# Patient Record
Sex: Female | Born: 1945 | Race: White | Hispanic: No | Marital: Married | Smoking: Former smoker
Health system: Southern US, Community
[De-identification: ages and names within clinical notes are randomized; demographics above are authoritative.]

## PROBLEM LIST (undated history)

## (undated) DIAGNOSIS — T7840XA Allergy, unspecified, initial encounter: Secondary | ICD-10-CM

## (undated) DIAGNOSIS — I10 Essential (primary) hypertension: Secondary | ICD-10-CM

## (undated) DIAGNOSIS — E079 Disorder of thyroid, unspecified: Secondary | ICD-10-CM

## (undated) DIAGNOSIS — F419 Anxiety disorder, unspecified: Secondary | ICD-10-CM

## (undated) DIAGNOSIS — N289 Disorder of kidney and ureter, unspecified: Secondary | ICD-10-CM

## (undated) HISTORY — DX: Allergy, unspecified, initial encounter: T78.40XA

## (undated) HISTORY — DX: Essential (primary) hypertension: I10

## (undated) HISTORY — PX: CATARACT EXTRACTION: SUR2

## (undated) HISTORY — PX: LASIK: SHX215

## (undated) HISTORY — PX: ABDOMINAL HYSTERECTOMY: SHX81

## (undated) HISTORY — DX: Anxiety disorder, unspecified: F41.9

## (undated) HISTORY — DX: Disorder of thyroid, unspecified: E07.9

---

## 2000-08-13 ENCOUNTER — Ambulatory Visit (HOSPITAL_COMMUNITY): Admission: RE | Admit: 2000-08-13 | Discharge: 2000-08-13 | Payer: Self-pay | Admitting: Gastroenterology

## 2000-08-13 ENCOUNTER — Encounter (INDEPENDENT_AMBULATORY_CARE_PROVIDER_SITE_OTHER): Payer: Self-pay | Admitting: Specialist

## 2000-11-14 ENCOUNTER — Emergency Department (HOSPITAL_COMMUNITY): Admission: EM | Admit: 2000-11-14 | Discharge: 2000-11-14 | Payer: Self-pay | Admitting: Emergency Medicine

## 2001-01-25 ENCOUNTER — Encounter: Payer: Self-pay | Admitting: Obstetrics and Gynecology

## 2001-01-25 ENCOUNTER — Encounter: Admission: RE | Admit: 2001-01-25 | Discharge: 2001-01-25 | Payer: Self-pay | Admitting: Obstetrics and Gynecology

## 2002-03-09 ENCOUNTER — Encounter: Payer: Self-pay | Admitting: Obstetrics and Gynecology

## 2002-03-09 ENCOUNTER — Encounter: Admission: RE | Admit: 2002-03-09 | Discharge: 2002-03-09 | Payer: Self-pay | Admitting: Obstetrics and Gynecology

## 2002-03-16 ENCOUNTER — Encounter: Payer: Self-pay | Admitting: Obstetrics and Gynecology

## 2002-03-16 ENCOUNTER — Encounter: Admission: RE | Admit: 2002-03-16 | Discharge: 2002-03-16 | Payer: Self-pay | Admitting: Obstetrics and Gynecology

## 2003-04-24 ENCOUNTER — Encounter: Payer: Self-pay | Admitting: Obstetrics and Gynecology

## 2003-04-24 ENCOUNTER — Encounter: Admission: RE | Admit: 2003-04-24 | Discharge: 2003-04-24 | Payer: Self-pay | Admitting: Obstetrics and Gynecology

## 2004-07-12 ENCOUNTER — Encounter: Admission: RE | Admit: 2004-07-12 | Discharge: 2004-07-12 | Payer: Self-pay | Admitting: Obstetrics and Gynecology

## 2004-08-29 ENCOUNTER — Encounter: Admission: RE | Admit: 2004-08-29 | Discharge: 2004-08-29 | Payer: Self-pay | Admitting: Endocrinology

## 2005-09-30 ENCOUNTER — Encounter: Admission: RE | Admit: 2005-09-30 | Discharge: 2005-09-30 | Payer: Self-pay | Admitting: Obstetrics and Gynecology

## 2006-11-04 ENCOUNTER — Encounter: Admission: RE | Admit: 2006-11-04 | Discharge: 2006-11-04 | Payer: Self-pay | Admitting: Endocrinology

## 2008-04-13 ENCOUNTER — Encounter: Admission: RE | Admit: 2008-04-13 | Discharge: 2008-04-13 | Payer: Self-pay | Admitting: Internal Medicine

## 2010-02-04 ENCOUNTER — Encounter: Admission: RE | Admit: 2010-02-04 | Discharge: 2010-02-04 | Payer: Self-pay | Admitting: Internal Medicine

## 2010-02-08 ENCOUNTER — Encounter: Admission: RE | Admit: 2010-02-08 | Discharge: 2010-02-08 | Payer: Self-pay | Admitting: Internal Medicine

## 2011-01-09 ENCOUNTER — Other Ambulatory Visit: Payer: Self-pay | Admitting: Internal Medicine

## 2011-01-09 DIAGNOSIS — Z1231 Encounter for screening mammogram for malignant neoplasm of breast: Secondary | ICD-10-CM

## 2011-03-13 ENCOUNTER — Ambulatory Visit
Admission: RE | Admit: 2011-03-13 | Discharge: 2011-03-13 | Disposition: A | Discharge status: Home / Self Care | Payer: 59 | Source: Ambulatory Visit | Attending: Internal Medicine | Admitting: Internal Medicine

## 2011-03-13 DIAGNOSIS — Z1231 Encounter for screening mammogram for malignant neoplasm of breast: Secondary | ICD-10-CM

## 2011-04-18 NOTE — Procedures (Signed)
[facility]  Patient:    Shannon Blevins, Shannon Blevins                      MRN: [id number] Proc. Date: 08/13/00 Adm. Date:  [id number] Disc. Date: [id number] Attending:  Rich Brave CC:         Reather Littler, M.D.   Procedure Report  PROCEDURE:  Colonoscopy.  INDICATIONS FOR PROCEDURE:  This is a 65 year old female with Hemoccult positive stool on 3/3 Hemoccults submitted to her primary physicians office. Her upper endoscopy was negative.  FINDINGS:  Normal exam to the terminal ileum.  DESCRIPTION OF PROCEDURE:  The nature, purpose and risk of the procedure had been discussed with the patient who provided written consent. This procedure was performed immediately following her upper endoscopy and total sedation for the 2 procedures was fentanyl 100 mcg and Versed 10 mg IV without arrhythmias or desaturations.  The Olympus pediatric video colonoscope was used initially for this procedure but it looped and locked too much in the rectosigmoid region so it was rapidly removed and I substituted the Olympus adult video colonoscope which was able to go through that area without excessive looping, and with overriding of loops, we advanced the colon quite readily. We turned the patient into the supine position to get around the hepatic flexure, reached the cecum and then entered the terminal ileum for a moderate distance. It had a normal mucosal appearance. Pullback was therefore performed.  This was a normal colonoscopy. No polyps, cancer, colitis, vascular malformations or diverticular disease were observed. Retroflexion in the rectum was normal. The quality of the prep was very good and it is felt that all areas were adequately seen.  No biopsies were obtained. The patient tolerated the procedure well and there were no apparent complications.  IMPRESSION:  Normal colonoscopy. No source of heme positive stool identified.  PLAN:  Home Hemoccults x 2. It is  presumed that the patients heme positivity probably resulted from transient gastric irritation related to her use of BCs on an occasional basis, although there was no evidence of aspirin related gastropathy on todays EGD at the present time.  If further Hemoccult testing is negative, since the patient is asymptomatic, I feel we could sit tight, without further diagnostic evaluation. DD:  08/13/00 TD:  08/15/00 Job: [id number]

## 2011-04-18 NOTE — Procedures (Signed)
[facility]  Patient:    Shannon Blevins, Shannon Blevins                      MRN: [id number] Proc. Date: 08/13/00 Adm. Date:  [id number] Disc. Date: [id number] Attending:  Rich Brave CC:         Reather Littler, M.D.   Procedure Report  PROCEDURE:  Upper endoscopy.  INDICATIONS FOR PROCEDURE:  A 65 year old with 3/3 Hemoccult positive stools on Hemoccults submitted to her primary physician. She was heme negative when checked by me at the office.  FINDINGS:  Normal exam.  DESCRIPTION OF PROCEDURE:  The nature, purpose and risk of the procedure had been discussed with the patient who provided written consent. Sedation was fentanyl 100 mcg and Versed 10 mg IV for this procedure and the colonoscopy which followed it. There was no problem with arrhythmias or desaturations.  The Olympus video endoscope was passed under direct vision. The vocal cords were not well seen but no obvious laryngeal abnormalities were noted. The esophagus was readily entered and was normal, without evidence of reflux esophagitis, gastroesophageal reflux, Barretts esophagus, varices, infection, neoplasia, ring, stricture or hiatal hernia.  The stomach was entered. It contained no significant residual and had normal mucosa without evidence of gastritis, erosions, ulcers, polyps or masses including a retroflexed view of the proximal stomach.  The pylorus, duodenal bulb and second duodenum looked normal.  The scope was then removed from the patient who tolerated the procedure well and without apparent complications.  IMPRESSION:  Normal upper endoscopy. No source of heme positive stool identified.  PLAN:  Proceed to colonoscopic evaluation. DD:  08/13/00 TD:  08/15/00 Job: [id number]

## 2012-10-04 ENCOUNTER — Other Ambulatory Visit: Payer: Self-pay | Admitting: Internal Medicine

## 2012-10-04 DIAGNOSIS — R109 Unspecified abdominal pain: Secondary | ICD-10-CM

## 2012-10-06 ENCOUNTER — Other Ambulatory Visit: Payer: Self-pay | Admitting: Internal Medicine

## 2012-10-06 DIAGNOSIS — Z1231 Encounter for screening mammogram for malignant neoplasm of breast: Secondary | ICD-10-CM

## 2012-10-07 ENCOUNTER — Other Ambulatory Visit: Payer: 59

## 2012-10-18 ENCOUNTER — Ambulatory Visit
Admission: RE | Admit: 2012-10-18 | Discharge: 2012-10-18 | Disposition: A | Discharge status: Home / Self Care | Payer: Medicare Other | Source: Ambulatory Visit | Attending: Internal Medicine | Admitting: Internal Medicine

## 2012-10-18 DIAGNOSIS — R109 Unspecified abdominal pain: Secondary | ICD-10-CM

## 2012-11-10 ENCOUNTER — Ambulatory Visit
Admission: RE | Admit: 2012-11-10 | Discharge: 2012-11-10 | Disposition: A | Discharge status: Home / Self Care | Payer: Medicare Other | Source: Ambulatory Visit | Attending: Internal Medicine | Admitting: Internal Medicine

## 2012-11-10 DIAGNOSIS — Z1231 Encounter for screening mammogram for malignant neoplasm of breast: Secondary | ICD-10-CM

## 2013-05-07 ENCOUNTER — Ambulatory Visit (INDEPENDENT_AMBULATORY_CARE_PROVIDER_SITE_OTHER): Payer: Medicare Other | Admitting: Family Medicine

## 2013-05-07 VITALS — BP 108/66 | HR 68 | Temp 98.0°F | Resp 16 | Ht 62.5 in | Wt 147.6 lb

## 2013-05-07 DIAGNOSIS — T1592XA Foreign body on external eye, part unspecified, left eye, initial encounter: Secondary | ICD-10-CM

## 2013-05-07 DIAGNOSIS — T1590XA Foreign body on external eye, part unspecified, unspecified eye, initial encounter: Secondary | ICD-10-CM

## 2013-05-07 DIAGNOSIS — H5789 Other specified disorders of eye and adnexa: Secondary | ICD-10-CM

## 2013-05-07 DIAGNOSIS — H579 Unspecified disorder of eye and adnexa: Secondary | ICD-10-CM

## 2013-05-07 NOTE — Progress Notes (Signed)
[facility]:  Office Visit  Chief Complaint:  Chief Complaint  Patient presents with  . Eye Pain    left eye feels like somethings in it since this morning    HPI: Shannon Blevins is a 67 y.o. female who complains of  Left eye irritation and was pulling weeds.  She has had cataract surgery. Denies glaucoma Denies vision changes. Denies light sensitivity, eye swelling. Feels like there is something in it. Does not feel itchy.  No sore throat, ear pain, fevers, chills or URI sxs She has had new eyeliner  Past Medical History  Diagnosis Date  . Allergy   . Anxiety   . Thyroid disease   . Hypertension    Past Surgical History  Procedure Laterality Date  . Abdominal hysterectomy    . Cataract extraction    . Lasik     History   Social History  . Marital Status: Married    Spouse Name: N/A    Number of Children: N/A  . Years of Education: N/A   Social History Main Topics  . Smoking status: Former Games developer  . Smokeless tobacco: None  . Alcohol Use: None  . Drug Use: None  . Sexually Active: None   Other Topics Concern  . None   Social History Narrative  . None   Family History  Problem Relation Age of Onset  . Cancer Mother   . Hypertension Mother   . Heart disease Mother   . Alcohol abuse Father   . Diabetes Paternal Grandmother   . Cancer Paternal Grandmother    Allergies  Allergen Reactions  . Sulfa Antibiotics    Prior to Admission medications   Medication Sig Start Date End Date Taking? Authorizing Provider  atenolol (TENORMIN) 50 MG tablet Take 50 mg by mouth daily.   Yes Historical Provider, MD  calcium-vitamin D (OSCAL WITH D) 250-125 MG-UNIT per tablet Take 1 tablet by mouth daily.   Yes Historical Provider, MD  cetirizine (ZYRTEC) 10 MG tablet Take 10 mg by mouth daily.   Yes Historical Provider, MD  diltiazem (DILACOR XR) 240 MG 24 hr capsule Take 240 mg by mouth daily.   Yes Historical Provider, MD  docusate sodium (COLACE)  100 MG capsule Take 100 mg by mouth 2 (two) times daily.   Yes Historical Provider, MD  estradiol (ESTRACE) 2 MG tablet Take 2 mg by mouth daily.   Yes Historical Provider, MD  ferrous fumarate (HEMOCYTE - 106 MG FE) 325 (106 FE) MG TABS Take 1 tablet by mouth.   Yes Historical Provider, MD  FLUoxetine (PROZAC) 40 MG capsule Take 40 mg by mouth daily.   Yes Historical Provider, MD  hydrochlorothiazide (HYDRODIURIL) 12.5 MG tablet Take 12.5 mg by mouth daily.   Yes Historical Provider, MD  levothyroxine (SYNTHROID, LEVOTHROID) 137 MCG tablet Take 137 mcg by mouth daily before breakfast.   Yes Historical Provider, MD  Multiple Vitamin (MULTIVITAMIN) tablet Take 1 tablet by mouth daily.   Yes Historical Provider, MD  butalbital-acetaminophen-caffeine (FIORICET WITH CODEINE) 50-325-40-30 MG per capsule Take 1 capsule by mouth every 4 (four) hours as needed for headache.    Historical Provider, MD     ROS: The patient denies fevers, chills, night sweats, unintentional weight loss, chest pain, palpitations, wheezing, dyspnea on exertion, nausea, vomiting, abdominal pain, dysuria, hematuria, melena, numbness, weakness, or tingling.   All other systems have been reviewed and were otherwise negative with the exception of those mentioned in  the HPI and as above.    PHYSICAL EXAM: Filed Vitals:   05/07/13 1316  BP: 108/66  Pulse: 68  Temp: 98 F (36.7 C)  Resp: 16   Filed Vitals:   05/07/13 1316  Height: 5' 2.5" (1.588 m)  Weight: 147 lb 9.6 oz (66.951 kg)   Body mass index is 26.55 kg/(m^2).  General: Alert, no acute distress HEENT:  Normocephalic, atraumatic, oropharynx patent. EOMI, PERRLA, fundoscopic exam grossly normal Cardiovascular:  Regular rate and rhythm, no rubs murmurs or gallops.  No Carotid bruits, radial pulse intact. No pedal edema.  Respiratory: Clear to auscultation bilaterally.  No wheezes, rales, or rhonchi.  No cyanosis, no use of accessory musculature GI: No  organomegaly, abdomen is soft and non-tender, positive bowel sounds.  No masses. Skin: No rashes. Neurologic: Facial musculature symmetric. Psychiatric: Patient is appropriate throughout our interaction. Lymphatic: No cervical lymphadenopathy Musculoskeletal: Gait intact.   LABS: No results found for this or any previous visit.   EKG/XRAY:   Primary read interpreted by Dr. Conley Rolls at [facility].   ASSESSMENT/PLAN: Encounter Diagnoses  Name Primary?  . Irritation of left eye Yes  . Foreign body in eye, left, initial encounter    Piece of Eye liner pigment  Found in upper left corner in eye No corneal abrasion on fluoroscein exam Flushed out eye with sterile saline, eye irritation resolved after flush F/u prn     Barbi Kumagai PHUONG, DO 05/07/2013 2:17 PM

## 2013-11-07 ENCOUNTER — Other Ambulatory Visit: Payer: Self-pay

## 2013-11-07 DIAGNOSIS — Z1231 Encounter for screening mammogram for malignant neoplasm of breast: Secondary | ICD-10-CM

## 2013-12-15 ENCOUNTER — Ambulatory Visit: Payer: Medicare Other

## 2014-01-26 ENCOUNTER — Ambulatory Visit: Payer: Medicare Other

## 2014-03-13 ENCOUNTER — Ambulatory Visit
Admission: RE | Admit: 2014-03-13 | Discharge: 2014-03-13 | Disposition: A | Discharge status: Home / Self Care | Payer: Medicare HMO | Source: Ambulatory Visit

## 2014-03-13 DIAGNOSIS — Z1231 Encounter for screening mammogram for malignant neoplasm of breast: Secondary | ICD-10-CM

## 2014-03-14 ENCOUNTER — Other Ambulatory Visit: Payer: Self-pay | Admitting: Internal Medicine

## 2014-03-14 DIAGNOSIS — R928 Other abnormal and inconclusive findings on diagnostic imaging of breast: Secondary | ICD-10-CM

## 2014-03-24 ENCOUNTER — Encounter (INDEPENDENT_AMBULATORY_CARE_PROVIDER_SITE_OTHER): Payer: Self-pay

## 2014-03-24 ENCOUNTER — Ambulatory Visit
Admission: RE | Admit: 2014-03-24 | Discharge: 2014-03-24 | Disposition: A | Discharge status: Home / Self Care | Payer: Medicare HMO | Source: Ambulatory Visit | Attending: Internal Medicine | Admitting: Internal Medicine

## 2014-03-24 DIAGNOSIS — R928 Other abnormal and inconclusive findings on diagnostic imaging of breast: Secondary | ICD-10-CM

## 2017-02-23 ENCOUNTER — Other Ambulatory Visit: Payer: Self-pay | Admitting: Internal Medicine

## 2017-02-23 DIAGNOSIS — Z1231 Encounter for screening mammogram for malignant neoplasm of breast: Secondary | ICD-10-CM

## 2017-03-19 ENCOUNTER — Ambulatory Visit
Admission: RE | Admit: 2017-03-19 | Discharge: 2017-03-19 | Disposition: A | Discharge status: Home / Self Care | Payer: Medicare HMO | Source: Ambulatory Visit | Attending: Internal Medicine | Admitting: Internal Medicine

## 2017-03-19 DIAGNOSIS — Z1231 Encounter for screening mammogram for malignant neoplasm of breast: Secondary | ICD-10-CM

## 2018-05-04 ENCOUNTER — Other Ambulatory Visit: Payer: Self-pay | Admitting: Internal Medicine

## 2018-05-04 DIAGNOSIS — Z1231 Encounter for screening mammogram for malignant neoplasm of breast: Secondary | ICD-10-CM

## 2018-05-27 ENCOUNTER — Ambulatory Visit
Admission: RE | Admit: 2018-05-27 | Discharge: 2018-05-27 | Disposition: A | Discharge status: Home / Self Care | Payer: Medicare HMO | Source: Ambulatory Visit | Attending: Internal Medicine | Admitting: Internal Medicine

## 2018-05-27 DIAGNOSIS — Z1231 Encounter for screening mammogram for malignant neoplasm of breast: Secondary | ICD-10-CM

## 2018-05-28 ENCOUNTER — Other Ambulatory Visit: Payer: Self-pay | Admitting: Internal Medicine

## 2018-05-28 DIAGNOSIS — R928 Other abnormal and inconclusive findings on diagnostic imaging of breast: Secondary | ICD-10-CM

## 2018-06-02 ENCOUNTER — Other Ambulatory Visit: Payer: Medicare HMO

## 2018-07-30 ENCOUNTER — Ambulatory Visit
Admission: RE | Admit: 2018-07-30 | Discharge: 2018-07-30 | Disposition: A | Discharge status: Home / Self Care | Payer: Medicare HMO | Source: Ambulatory Visit | Attending: Internal Medicine | Admitting: Internal Medicine

## 2018-07-30 DIAGNOSIS — R928 Other abnormal and inconclusive findings on diagnostic imaging of breast: Secondary | ICD-10-CM

## 2019-05-15 IMAGING — MG DIGITAL DIAGNOSTIC UNILATERAL RIGHT MAMMOGRAM WITH TOMO AND CAD
6 series · 6 of 18 positions shown · non-contrast
Comparison: May 27, 2018 and multiple earlier priors

CLINICAL DATA: 72-year-old patient recalled from recent 2D
screening mammogram for evaluation of possible distortion identified
only on the CC view of the right breast.

EXAM:
DIGITAL DIAGNOSTIC RIGHT MAMMOGRAM WITH CAD AND TOMO
ULTRASOUND RIGHT BREAST

[R CC synth-2D (1 of 2)]
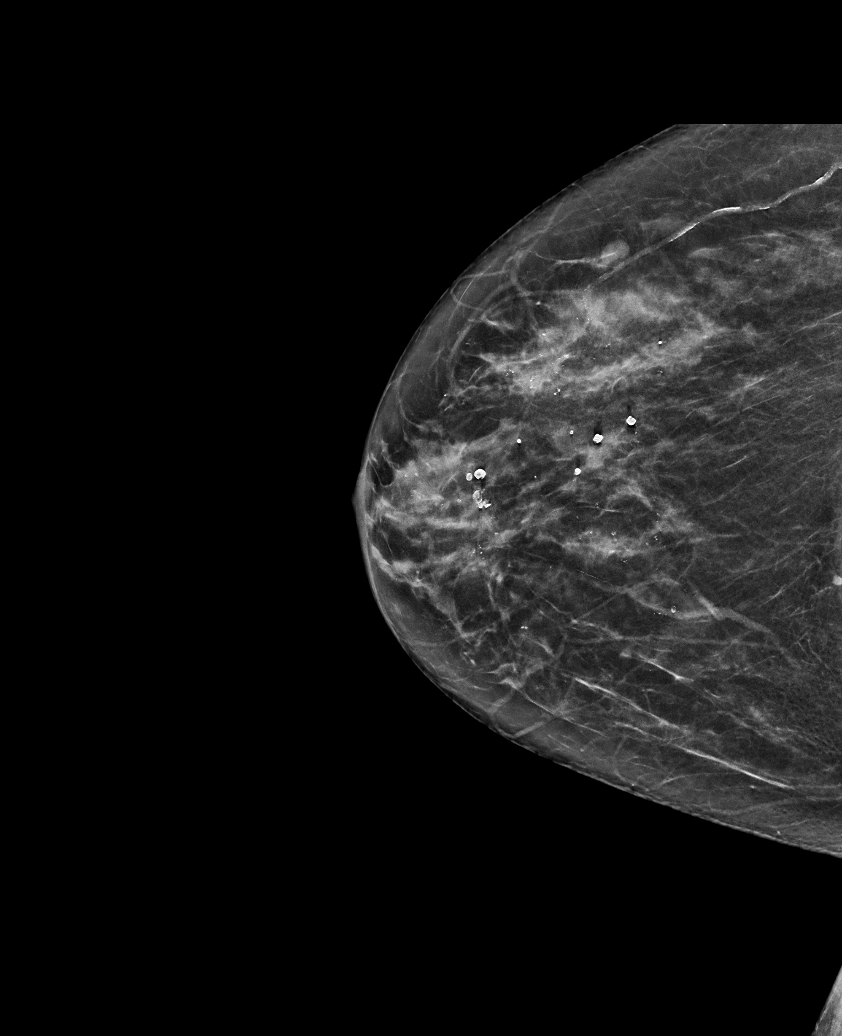

[R MLO synth-2D]
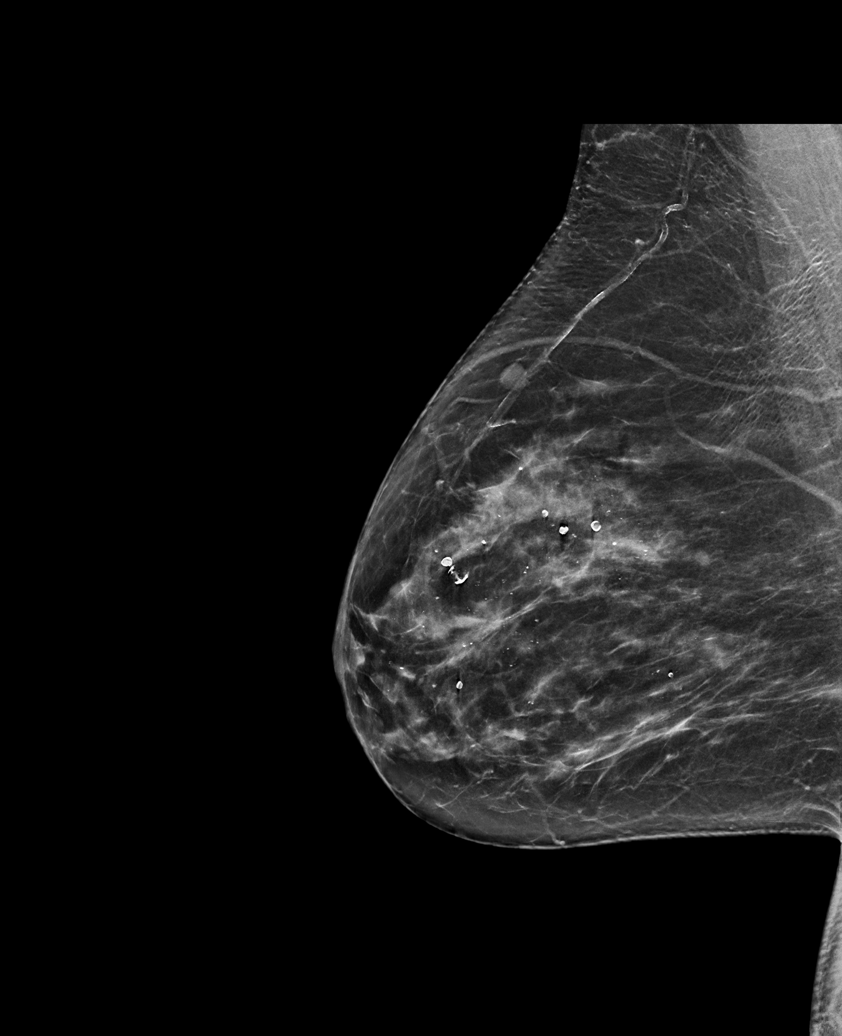

[R CC synth-2D (2 of 2)]
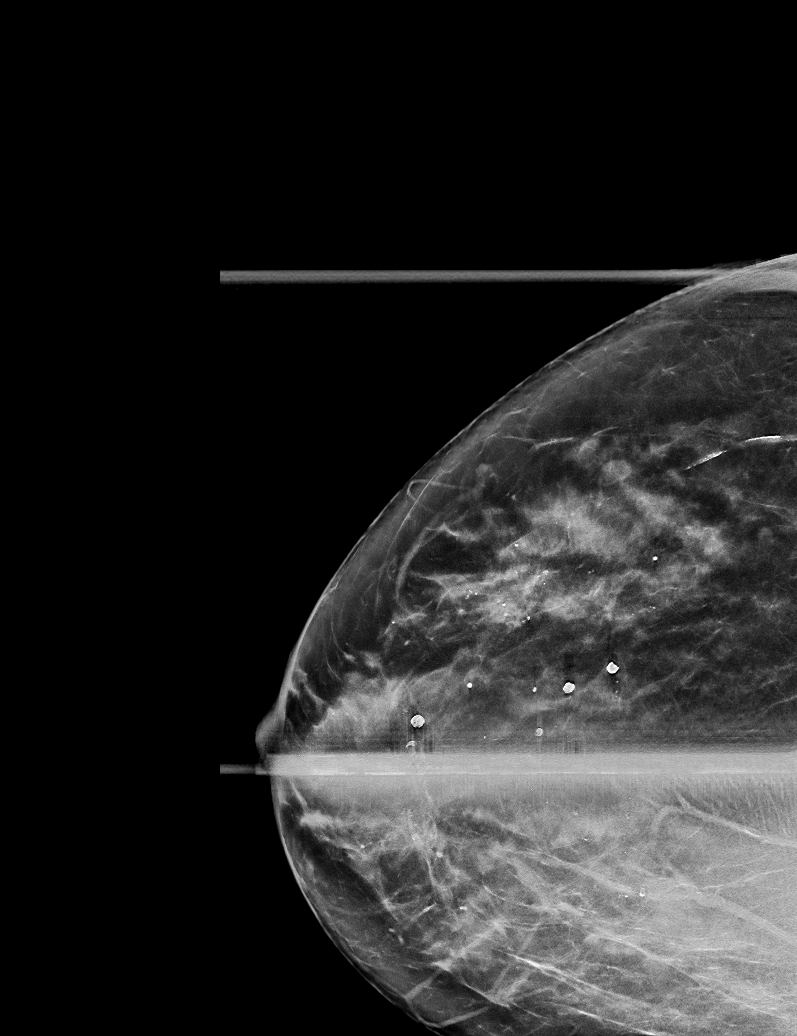

[R CC tomo (1 of 2) · tomo slice 29/58.0]
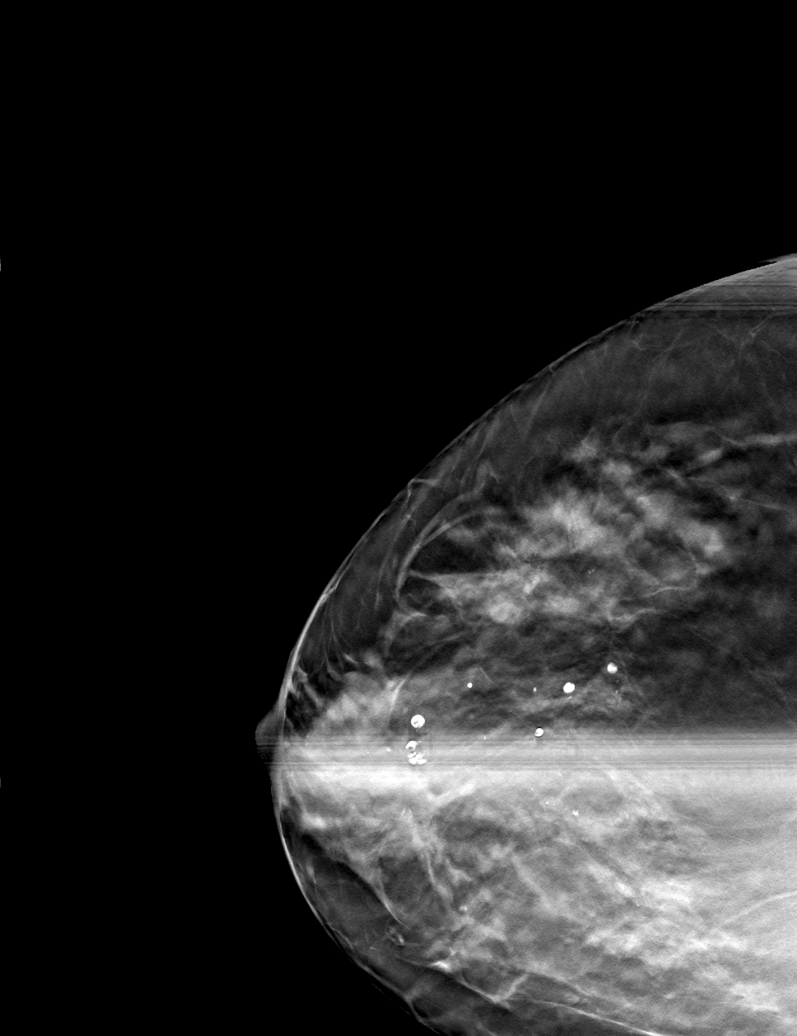

[R CC tomo (2 of 2) · tomo slice 31/61.0]
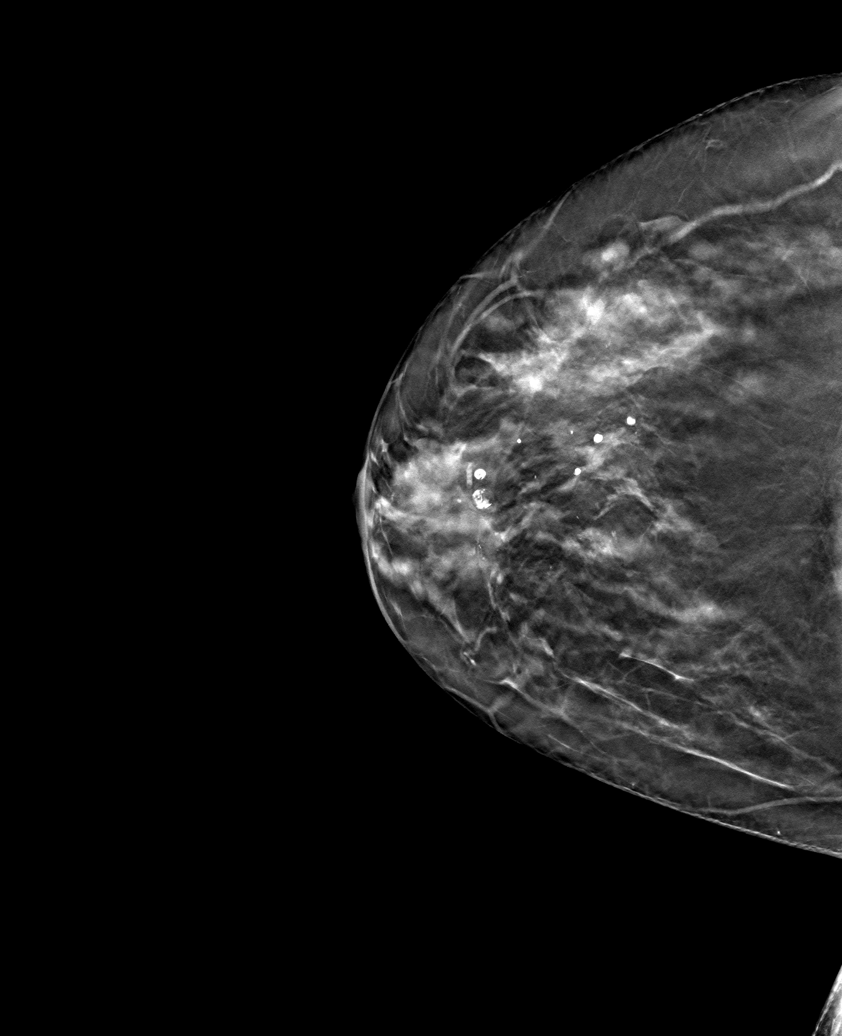

[R MLO tomo · tomo slice 36/71.0]
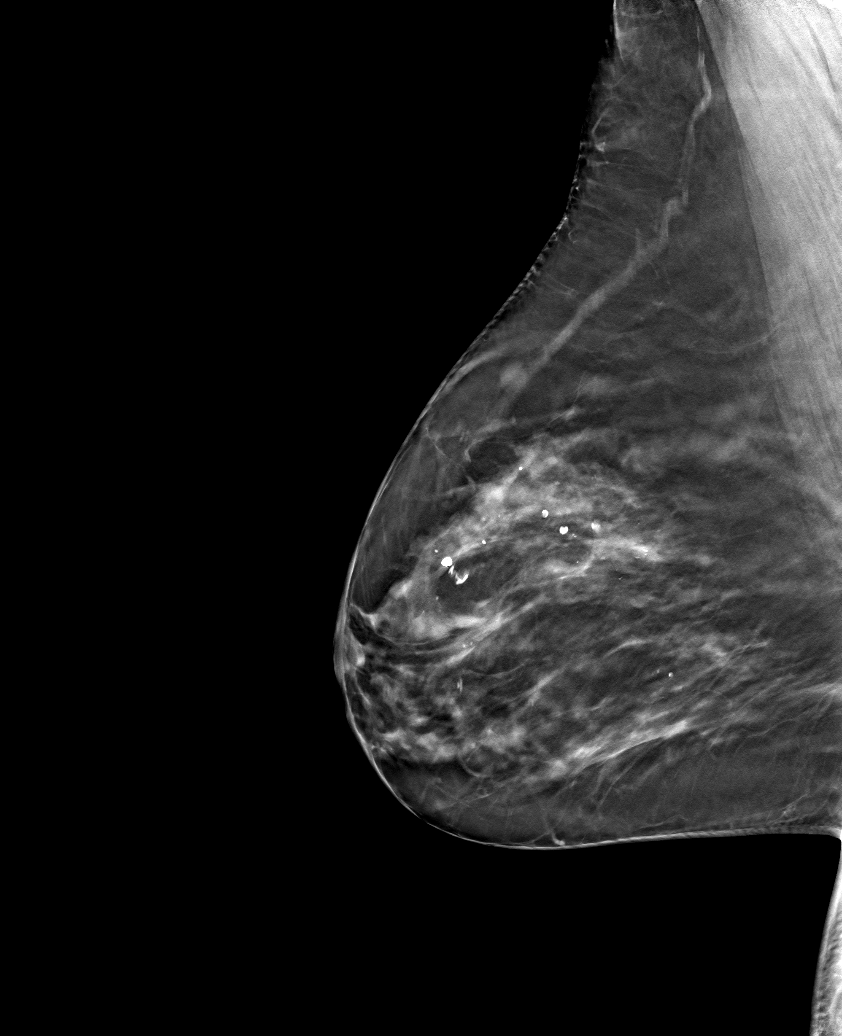

[6 of 18 positions shown; findings below may reference images not displayed]

ACR Breast Density Category c: The breast tissue is heterogeneously
dense, which may obscure small masses.
FINDINGS: Whole breast images in the CC and MLO projection with tomography are
performed. There is an island of dense glandular tissue in the outer
right breast that appears similar to prior mammograms. In the past,
there have been known cysts in this tissue that fluctuate in size
over time. No architectural distortion is identified in this region
on today's mammogram images.

Mammographic images were processed with CAD.

On physical exam, the outer right breast is soft to palpation. No
thickening or mass is felt.

Targeted ultrasound is performed, showing an island of dense
glandular tissue containing a few subcentimeter cysts in the [DATE]
position 5 cm from the nipple, corresponding to the island of dense
tissue on the mammogram. No architectural distortion, abnormal
shadowing, or suspicious mass is seen.
IMPRESSION: No suspicious findings. Dense fibroglandular tissue in the outer
right breast containing a few small cysts. Negative for
architectural distortion.

RECOMMENDATION:
Screening mammogram in one year.(Code:30-W-2MW)

I have discussed the findings and recommendations with the patient.
Results were also provided in writing at the conclusion of the
visit. If applicable, a reminder letter will be sent to the patient
regarding the next appointment.

BI-RADS CATEGORY  2: Benign.

## 2020-04-02 ENCOUNTER — Other Ambulatory Visit: Payer: Self-pay | Admitting: Internal Medicine

## 2020-04-02 ENCOUNTER — Other Ambulatory Visit: Payer: Self-pay

## 2020-04-02 DIAGNOSIS — Z1231 Encounter for screening mammogram for malignant neoplasm of breast: Secondary | ICD-10-CM

## 2020-04-02 DIAGNOSIS — E2839 Other primary ovarian failure: Secondary | ICD-10-CM

## 2020-06-12 ENCOUNTER — Ambulatory Visit: Payer: Medicare HMO

## 2020-06-12 ENCOUNTER — Other Ambulatory Visit: Payer: Medicare HMO

## 2020-08-07 ENCOUNTER — Ambulatory Visit: Payer: Medicare HMO | Admitting: Urology

## 2020-08-27 ENCOUNTER — Other Ambulatory Visit: Payer: Self-pay

## 2020-08-27 ENCOUNTER — Encounter: Payer: Self-pay | Admitting: Urology

## 2020-08-27 ENCOUNTER — Ambulatory Visit (INDEPENDENT_AMBULATORY_CARE_PROVIDER_SITE_OTHER): Payer: Medicare HMO | Admitting: Urology

## 2020-08-27 VITALS — BP 142/73 | HR 62 | Temp 97.5°F | Ht 62.5 in | Wt 148.0 lb

## 2020-08-27 DIAGNOSIS — N3942 Incontinence without sensory awareness: Secondary | ICD-10-CM

## 2020-08-27 LAB — URINALYSIS, ROUTINE W REFLEX MICROSCOPIC
Bilirubin, UA: NEGATIVE
Glucose, UA: NEGATIVE
Ketones, UA: NEGATIVE
Leukocytes,UA: NEGATIVE
Nitrite, UA: NEGATIVE
Protein,UA: NEGATIVE
RBC, UA: NEGATIVE
Specific Gravity, UA: 1.025 (ref 1.005–1.030)
Urobilinogen, Ur: 0.2 mg/dL (ref 0.2–1.0)
pH, UA: 5.5 (ref 5.0–7.5)

## 2020-08-27 NOTE — Progress Notes (Signed)
08/27/2020 10:41 AM   Shannon Blevins 1946-05-31 [id number]  Referring provider: Ralene Ok, MD [address] [place],  [place] [address]  Urinary incontinence  HPI: [name] is a 74yo here for followup for urinary incontinence. 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  She uses 2-3 pads per day which are soaked.   Her records from [facility] are as follows: I leak when I have the urge to urinate.  HPI: Shannon Blevins is a 74 year-old female established patient who is here for urge incontinence.   She does have problems getting to the bathroom in time after she has the urge to urinate. She has had an accident when she couldn't get to the bathroom in time . She has 4 episodes of urge incontinence per day. Her urge incontinence began approximately 05/31/2016. Her symptoms have gotten worse over the last year.   She does wear protective pads. She wears 1-2 pads per day. She generally urinates every 3 hours in the daytime. Patient denies getting up to urinate in the night. She is not having problems with emptying her bladder well.   06/11/2018: For the past 2 years she has noticed worsening urinary incontinence. She has been on trospium 20mg  BID for 2 years which has partially improves her incontinence. She has leakage with coughing and sneezing. She is unaware of her urinary incontinence. The worst leakage is when she stands up from a sitting position. She has a hx of bladder tack and questionable cystocele repair at the time of her hysterectomy in 1994.   09/09/2018: She is using premarin cream 3x per week and she notes improvement in her urgency, nocturia and incontinence.   01/28/2019: He continues to use premarin cream 3x per week with good results. she has SUI and wears 2-3 pads per day.   -07/02/20-patient is a 74 year old white female previously followed by Dr. 66 with history of urge incontinence. Patient states she has been off all GU medications  including Myrbetriq and Premarin as they were not helping her. She states that she uses pads "all the time now. She goes through at least 3-4 large pads per day. Her incontinence is unpredictable but does seem to had occur when she stands and is not associated with significant amount of urgency. She has mild stress component to her incontinence. The patient is adamant that Dr. Ronne Binning stated that a simple surgical procedure would help "fix a tear" and she was ready to have this done. I cannot note any previously made note from Dr. Ronne Binning stating that he was going to proceed with surgical procedure.  Cath urine today revealed minimal residual. Urinalysis today is clear   PMH: Past Medical History:  Diagnosis Date  . Allergy   . Anxiety   . Hypertension   . Thyroid disease     Surgical History: Past Surgical History:  Procedure Laterality Date  . ABDOMINAL HYSTERECTOMY    . CATARACT EXTRACTION    . LASIK      Home Medications:  Allergies as of 08/27/2020      Reactions   Sulfa Antibiotics       Medication List       Accurate as of August 27, 2020 10:41 AM. If you have any questions, ask your nurse or doctor.        atenolol 50 MG tablet Commonly known as: TENORMIN Take 50 mg by mouth daily.   butalbital-acetaminophen-caffeine 50-325-40-30 MG capsule Commonly known as: FIORICET WITH CODEINE  Take 1 capsule by mouth every 4 (four) hours as needed for headache.   calcium-vitamin D 250-125 MG-UNIT tablet Commonly known as: OSCAL WITH D Take 1 tablet by mouth daily.   calcium-vitamin D 500-200 MG-UNIT tablet Commonly known as: OSCAL WITH D Take 1 tablet by mouth.   cetirizine 10 MG tablet Commonly known as: ZYRTEC Take 10 mg by mouth daily.   DEPLIN 15 PO Take by mouth.   diltiazem 240 MG 24 hr capsule Commonly known as: DILACOR XR Take 240 mg by mouth daily.   docusate sodium 100 MG capsule Commonly known as: COLACE Take 100 mg by mouth 2 (two) times  daily.   estradiol 2 MG tablet Commonly known as: ESTRACE Take 2 mg by mouth daily.   ferrous fumarate 325 (106 Fe) MG Tabs tablet Commonly known as: HEMOCYTE - 106 mg FE Take 1 tablet by mouth.   ferrous sulfate 324 MG Tbec Take 324 mg by mouth.   Fish Oil 1000 MG Caps Take by mouth.   FLUoxetine 40 MG capsule Commonly known as: PROZAC Take 40 mg by mouth daily.   hydrochlorothiazide 12.5 MG tablet Commonly known as: HYDRODIURIL Take 12.5 mg by mouth daily.   levothyroxine 137 MCG tablet Commonly known as: SYNTHROID Take 137 mcg by mouth daily before breakfast.   losartan-hydrochlorothiazide 100-25 MG tablet Commonly known as: HYZAAR Take 1 tablet by mouth daily.   multivitamin tablet Take 1 tablet by mouth daily.   PARoxetine 20 MG tablet Commonly known as: PAXIL Take 20 mg by mouth daily.   QC Tumeric Complex 500 MG Caps Generic drug: Turmeric Take by mouth.       Allergies:  Allergies  Allergen Reactions  . Sulfa Antibiotics     Family History: Family History  Problem Relation Age of Onset  . Cancer Mother   . Hypertension Mother   . Heart disease Mother   . Alcohol abuse Father   . Diabetes Paternal Grandmother   . Cancer Paternal Grandmother     Social History:  reports that she quit smoking about 19 years ago. She has a 20.00 pack-year smoking history. She has never used smokeless tobacco. No history on file for alcohol use and drug use.  ROS: All other review of systems were reviewed and are negative except what is noted above in HPI  Physical Exam: BP (!) 142/73   Pulse 62   Temp (!) 97.5 F (36.4 C)   Ht 5' 2.5" (1.588 m)   Wt 148 lb (67.1 kg)   BMI 26.64 kg/m   Constitutional:  Alert and oriented, No acute distress. HEENT: [place] AT, moist mucus membranes.  Trachea midline, no masses. Cardiovascular: No clubbing, cyanosis, or edema. Respiratory: Normal respiratory effort, no increased work of breathing. GI: Abdomen is soft,  nontender, nondistended, no abdominal masses GU: No CVA tenderness.  Lymph: No cervical or inguinal lymphadenopathy. Skin: No rashes, bruises or suspicious lesions. Neurologic: Grossly intact, no focal deficits, moving all 4 extremities. Psychiatric: Normal mood and affect.  Laboratory Data: No results found for: WBC, HGB, HCT, MCV, PLT  No results found for: CREATININE  No results found for: PSA  No results found for: TESTOSTERONE  No results found for: HGBA1C  Urinalysis No results found for: COLORURINE, APPEARANCEUR, LABSPEC, PHURINE, GLUCOSEU, HGBUR, BILIRUBINUR, KETONESUR, PROTEINUR, UROBILINOGEN, NITRITE, LEUKOCYTESUR  No results found for: LABMICR, WBCUA, RBCUA, LABEPIT, MUCUS, BACTERIA  Pertinent Imaging:  No results found for this or any previous visit.  No results found  for this or any previous visit.  No results found for this or any previous visit.  No results found for this or any previous visit.  No results found for this or any previous visit.  No results found for this or any previous visit.  No results found for this or any previous visit.  No results found for this or any previous visit.   Assessment & Plan:    1. Urinary incontinence without sensory awareness -We will schedule urodynamics. I will see her back in 4 weeks and we will discuss treatments based on UDS findings.    No follow-ups on file.  Wilkie Aye, MD  [facility] Urology [place]

## 2020-08-27 NOTE — Progress Notes (Signed)
Urological Symptom Review  Patient is experiencing the following symptoms: Hard to postpone urination Get up at night to urinate Leakage of urine   Review of Systems  Gastrointestinal (upper)  : Negative for upper GI symptoms  Gastrointestinal (lower) : Negative for lower GI symptoms  Constitutional : Negative for symptoms  Skin: Negative for skin symptoms  Eyes: Negative for eye symptoms  Ear/Nose/Throat : Negative for Ear/Nose/Throat symptoms  Hematologic/Lymphatic: Negative for Hematologic/Lymphatic symptoms  Cardiovascular : Negative for cardiovascular symptoms  Respiratory : Negative for respiratory symptoms  Endocrine: Negative for endocrine symptoms  Musculoskeletal: Negative for musculoskeletal symptoms  Neurological: Negative for neurological symptoms  Psychologic: Negative for psychiatric symptoms 

## 2020-08-27 NOTE — Patient Instructions (Signed)

## 2020-09-24 ENCOUNTER — Ambulatory Visit: Payer: Medicare HMO

## 2020-09-24 ENCOUNTER — Other Ambulatory Visit: Payer: Self-pay

## 2020-09-24 ENCOUNTER — Other Ambulatory Visit: Payer: Medicare HMO

## 2020-09-24 ENCOUNTER — Other Ambulatory Visit: Payer: Self-pay | Admitting: Dermatology

## 2020-09-24 MED ORDER — BETAMETHASONE DIPROPIONATE 0.05 % EX CREA: TOPICAL_CREAM | Freq: Every day | CUTANEOUS | 0 refills | Status: DC

## 2020-09-24 NOTE — Telephone Encounter (Signed)
Wants betamethasone refill. [facility]: [facility] [address]. Appt. Is 12/10/20. Chart #13406 (grey) in back.

## 2020-09-24 NOTE — Telephone Encounter (Signed)
Patient aware ov is needed, she has a current flare on legs x 1 ok to fill no refills must keep ov.

## 2020-09-26 ENCOUNTER — Telehealth: Payer: Self-pay | Admitting: Urology

## 2020-09-26 NOTE — Telephone Encounter (Signed)
Patient called and LM stating she has completed the appointments to [facility] and wanted Dr Ronne Binning to know.

## 2020-09-27 NOTE — Telephone Encounter (Signed)
See below

## 2020-09-28 NOTE — Telephone Encounter (Signed)
Ok thanks 

## 2020-10-08 ENCOUNTER — Ambulatory Visit: Payer: Medicare HMO

## 2020-10-23 ENCOUNTER — Ambulatory Visit: Payer: Medicare HMO

## 2020-10-24 ENCOUNTER — Ambulatory Visit: Payer: Medicare HMO

## 2020-10-30 ENCOUNTER — Ambulatory Visit (INDEPENDENT_AMBULATORY_CARE_PROVIDER_SITE_OTHER): Payer: Medicare HMO | Admitting: Urology

## 2020-10-30 ENCOUNTER — Encounter: Payer: Self-pay | Admitting: Urology

## 2020-10-30 ENCOUNTER — Other Ambulatory Visit: Payer: Self-pay

## 2020-10-30 VITALS — BP 140/61 | HR 67 | Temp 98.0°F | Ht 62.5 in | Wt 148.0 lb

## 2020-10-30 DIAGNOSIS — N3942 Incontinence without sensory awareness: Secondary | ICD-10-CM

## 2020-10-30 DIAGNOSIS — N393 Stress incontinence (female) (male): Secondary | ICD-10-CM

## 2020-10-30 LAB — URINALYSIS, ROUTINE W REFLEX MICROSCOPIC
Bilirubin, UA: NEGATIVE
Glucose, UA: NEGATIVE
Ketones, UA: NEGATIVE
Leukocytes,UA: NEGATIVE
Nitrite, UA: NEGATIVE
Protein,UA: NEGATIVE
RBC, UA: NEGATIVE
Specific Gravity, UA: 1.025 (ref 1.005–1.030)
Urobilinogen, Ur: 0.2 mg/dL (ref 0.2–1.0)
pH, UA: 5.5 (ref 5.0–7.5)

## 2020-10-30 NOTE — Progress Notes (Signed)
Urological Symptom Review  Patient is experiencing the following symptoms: Frequent urination Hard to postpone urination Get up at night to urinate Leakage of urine   Review of Systems  Gastrointestinal (upper)  : Negative for upper GI symptoms  Gastrointestinal (lower) : Constipation  Constitutional : Weight loss  Skin: Negative for skin symptoms  Eyes: Negative for eye symptoms  Ear/Nose/Throat : Sinus problems  Hematologic/Lymphatic: Easy bruising  Cardiovascular : Negative for cardiovascular symptoms  Respiratory : Cough  Endocrine: Negative for endocrine symptoms  Musculoskeletal: Back pain  Neurological: Headaches  Psychologic: Anxiety

## 2020-10-30 NOTE — Progress Notes (Signed)
10/30/2020 3:26 PM   Shannon Blevins February 28, 1946 [id number]  Referring provider: Ralene Ok, MD [id number] Shannon Blev[place],  [place] [address]  followup urinary incontinence  HPI: [name] is a 74yo here for followup for urinary incontinence. Her incontinence has been refractory to OAB therapy. Per patient she has SUI and uses 2-3 pads per day. Shannon Blevins UDS which showed SUI and unstable detrusor contractions associated with her SUI. She is requesting surgical management if possible for her SUI.   PMH: Past Medical History:  Diagnosis Date  . Allergy   . Anxiety   . Hypertension   . Thyroid disease     Surgical History: Past Surgical History:  Procedure Laterality Date  . ABDOMINAL HYSTERECTOMY    . CATARACT EXTRACTION    . LASIK      Home Medications:  Allergies as of 10/30/2020      Reactions   Sulfa Antibiotics       Medication List       Accurate as of October 30, 2020  3:26 PM. If you have any questions, ask your nurse or doctor.        atenolol 50 MG tablet Commonly known as: TENORMIN Take 50 mg by mouth daily.   betamethasone dipropionate 0.05 % cream Apply topically daily.   butalbital-acetaminophen-caffeine 50-325-40-30 MG capsule Commonly known as: FIORICET WITH CODEINE Take 1 capsule by mouth every 4 (four) hours as needed for headache.   calcium-vitamin D 250-125 MG-UNIT tablet Commonly known as: OSCAL WITH D Take 1 tablet by mouth daily.   calcium-vitamin D 500-200 MG-UNIT tablet Commonly known as: OSCAL WITH D Take 1 tablet by mouth.   cetirizine 10 MG tablet Commonly known as: ZYRTEC Take 10 mg by mouth daily.   DEPLIN 15 PO Take by mouth.   diltiazem 240 MG 24 hr capsule Commonly known as: DILACOR XR Take 240 mg by mouth daily.   docusate sodium 100 MG capsule Commonly known as: COLACE Take 100 mg by mouth 2 (two) times daily.   estradiol 2 MG tablet Commonly known as: ESTRACE Take 2 mg by mouth daily.   ferrous  fumarate 325 (106 Fe) MG Tabs tablet Commonly known as: HEMOCYTE - 106 mg FE Take 1 tablet by mouth.   ferrous sulfate 324 MG Tbec Take 324 mg by mouth.   Fish Oil 1000 MG Caps Take by mouth.   FLUoxetine 40 MG capsule Commonly known as: PROZAC Take 40 mg by mouth daily.   hydrochlorothiazide 12.5 MG tablet Commonly known as: HYDRODIURIL Take 12.5 mg by mouth daily.   levothyroxine 137 MCG tablet Commonly known as: SYNTHROID Take 137 mcg by mouth daily before breakfast.   losartan-hydrochlorothiazide 100-25 MG tablet Commonly known as: HYZAAR Take 1 tablet by mouth daily.   multivitamin tablet Take 1 tablet by mouth daily.   PARoxetine 20 MG tablet Commonly known as: PAXIL Take 20 mg by mouth daily.   QC Tumeric Complex 500 MG Caps Generic drug: Turmeric Take by mouth.       Allergies:  Allergies  Allergen Reactions  . Sulfa Antibiotics     Family History: Family History  Problem Relation Age of Onset  . Cancer Mother   . Hypertension Mother   . Heart disease Mother   . Alcohol abuse Father   . Diabetes Paternal Grandmother   . Cancer Paternal Grandmother     Social History:  reports that she quit smoking about 19 years ago. She has a 20.00 pack-year  smoking history. She has never used smokeless tobacco. No history on file for alcohol use and drug use.  ROS: All other review of systems were reviewed and are negative except what is noted above in HPI  Physical Exam: BP 140/61   Pulse 67   Temp 98 F (36.7 C)   Ht 5' 2.5" (1.588 m)   Wt 148 lb (67.1 kg)   BMI 26.64 kg/m   Constitutional:  Alert and oriented, No acute distress. HEENT: [place] AT, moist mucus membranes.  Trachea midline, no masses. Cardiovascular: No clubbing, cyanosis, or edema. Respiratory: Normal respiratory effort, no increased work of breathing. GI: Abdomen is soft, nontender, nondistended, no abdominal masses GU: No CVA tenderness.  Lymph: No cervical or inguinal  lymphadenopathy. Skin: No rashes, bruises or suspicious lesions. Neurologic: Grossly intact, no focal deficits, moving all 4 extremities. Psychiatric: Normal mood and affect.  Laboratory Data: No results found for: WBC, HGB, HCT, MCV, PLT  No results found for: CREATININE  No results found for: PSA  No results found for: TESTOSTERONE  No results found for: HGBA1C  Urinalysis    Component Value Date/Time   APPEARANCEUR Clear 08/27/2020 1127   GLUCOSEU Negative 08/27/2020 1127   BILIRUBINUR Negative 08/27/2020 1127   PROTEINUR Negative 08/27/2020 1127   NITRITE Negative 08/27/2020 1127   LEUKOCYTESUR Negative 08/27/2020 1127    Lab Results  Component Value Date   LABMICR Comment 08/27/2020    Pertinent Imaging: Urodynamics: Images reviewed and discussed with the patient No results found for this or any previous visit.  No results found for this or any previous visit.  No results found for this or any previous visit.  No results found for this or any previous visit.  No results found for this or any previous visit.  No results found for this or any previous visit.  No results found for this or any previous visit.  No results found for this or any previous visit.   Assessment & Plan:    1. Urinary incontinence without sensory awareness -referral to Dr. Sherron Monday - Urinalysis, Routine w reflex microscopic  2. SUI (stress urinary incontinence, female) -referral to Dr. Sherron Monday - Ambulatory referral to Urology   No follow-ups on file.  Wilkie Aye, MD  [facility] [place]

## 2020-10-30 NOTE — Patient Instructions (Signed)

## 2020-11-05 ENCOUNTER — Ambulatory Visit: Payer: Medicare HMO

## 2020-12-10 ENCOUNTER — Ambulatory Visit: Payer: Medicare Other | Admitting: Dermatology

## 2020-12-10 ENCOUNTER — Other Ambulatory Visit: Payer: Self-pay

## 2020-12-10 ENCOUNTER — Encounter: Payer: Self-pay | Admitting: Dermatology

## 2020-12-10 DIAGNOSIS — Z1283 Encounter for screening for malignant neoplasm of skin: Secondary | ICD-10-CM

## 2020-12-10 NOTE — Progress Notes (Signed)
Clean exam read only on computer

## 2021-01-09 ENCOUNTER — Other Ambulatory Visit: Payer: Self-pay | Admitting: Dermatology

## 2021-01-14 ENCOUNTER — Other Ambulatory Visit: Payer: Self-pay | Admitting: Dermatology

## 2021-01-22 ENCOUNTER — Ambulatory Visit: Payer: Medicare HMO | Admitting: Urology

## 2021-01-28 ENCOUNTER — Ambulatory Visit: Payer: Medicare HMO | Admitting: Urology

## 2021-05-14 ENCOUNTER — Telehealth: Payer: Self-pay | Admitting: Dermatology

## 2021-05-14 NOTE — Telephone Encounter (Signed)
Pharmacy will be sending refill request for Betamethasone. She's currently taking Imiquimode Cream Rx'd by another provider for something different in another area.Is it OK to still use the Betamethasone?

## 2021-05-20 NOTE — Telephone Encounter (Signed)
Phone call to patient with Dr. Sherryl Barters recommendations regarding the use of the Betamethasone cream. Unable to leave a message for the patient.

## 2021-06-06 NOTE — Telephone Encounter (Signed)
Phone call to patient with Dr. Tafeen's recommendations. Voicemail left for patient to give the office a call back.  

## 2021-06-10 NOTE — Telephone Encounter (Signed)
Phone call to patient with Dr. Sherryl Barters recommendations. Patient states that every time she's called the office back we've been closed. Patient aware of Dr. Sherryl Barters recommendations, per patient her PCP Dr. Ludwig Clarks gave her the prescription for imiquimod to use on a rash on her back. Per patient she is no longer using the imiquimod and she still has that rash on her back. Patient does have an appointment already scheduled with Dr. Jorja Loa.

## 2021-06-24 ENCOUNTER — Other Ambulatory Visit (INDEPENDENT_AMBULATORY_CARE_PROVIDER_SITE_OTHER): Payer: Medicare Other | Admitting: Dermatology

## 2021-06-26 ENCOUNTER — Telehealth (INDEPENDENT_AMBULATORY_CARE_PROVIDER_SITE_OTHER): Payer: Medicare Other | Admitting: Dermatology

## 2021-06-26 NOTE — Telephone Encounter (Signed)
Patient calling to ask for a new RX for something stronger to treat eczema. She has been using Betamethasone cream 0.05% and PCP gave her a refill on that medication yesterday. She said she wanted something to take by mouth.

## 2021-06-28 NOTE — Telephone Encounter (Signed)
I reached Shannon Blevins by phone.  Her eczema is flaring with severe itching particularly on the chest and back.  We will switch her from betamethasone to clobetasol cream and she may cover the cream with a cool or tepid moist cloth for 20 minutes to try to relieve the itching.  I phoned this into [facility] lady with 3 refills, large straight size.  She will ask the pharmacist for a good Rx or single care card to reduce the cost.  She has my cell phone number if she needs to reach me.

## 2021-06-29 NOTE — Telephone Encounter (Signed)
Patient called and requested she be given cortisone pills.  I again patient I explained to her that I am not comfortable treating eczema with internal cortisone or prednisone; I am uncertain how receptive she was to my explanation.  She may add over-the-counter pramoxine based cream or lotion such as Goldbond anti-itch or CeraVe itch relief and use this as frequently as she wants if it provides some relief from the burning.  She already has a follow-up visit scheduled.

## 2021-07-05 ENCOUNTER — Encounter: Payer: Self-pay | Admitting: Dermatology

## 2021-07-05 ENCOUNTER — Telehealth (INDEPENDENT_AMBULATORY_CARE_PROVIDER_SITE_OTHER): Payer: Medicare Other | Admitting: Dermatology

## 2021-07-05 MED ORDER — TRIAMCINOLONE ACETONIDE 0.1 % EX CREA: 1.0000 "application " | TOPICAL_CREAM | Freq: Two times a day (BID) | CUTANEOUS | 2 refills | Status: DC

## 2021-07-05 NOTE — Telephone Encounter (Signed)
Shannon Blevins called me to tell me that she has gone through all of the prescribed medication and she cannot get it refilled and the rash is spreading and making her miserable.  I again discussed the option of initiating Dupixent if we are able to obtain it at an affordable price; she is already scheduled to see me in a couple weeks.  I tried calling her regular [facility] and there was no way I could leave a prescription.  I will try calling them again tomorrow (I am currently on the [facility]) for a 16ounce size triamcinolone 0.1% cream with 2 refills.  I did proceed to E prescribe 80 g of triamcinolone with 1 refill but requested the pharmacy provide 16 ounces or 456 g if available.

## 2021-07-05 NOTE — Progress Notes (Signed)
   Follow-Up Visit   Subjective  Shannon Blevins is a 75 y.o. female who presents for the following: Annual Exam (Patient here today for yearly skin check. No new concerns.).  Annual skin examination Location:  Duration:  Quality:  Associated Signs/Symptoms: Modifying Factors:  Severity:  Timing: Context:   Objective  Well appearing patient in no apparent distress; mood and affect are within normal limits. General skin examination, no atypical moles or nonmelanoma skin cancer.    A full examination was performed including scalp, head, eyes, ears, nose, lips, neck, chest, axillae, abdomen, back, buttocks, bilateral upper extremities, bilateral lower extremities, hands, feet, fingers, toes, fingernails, and toenails. All findings within normal limits unless otherwise noted below.  Areas beneath undergarments not fully examined.   Assessment & Plan    Encounter for screening for malignant neoplasm of skin  Annual skin examination.      I, Janalyn Harder, MD, have reviewed all documentation for this visit.  The documentation on 07/05/21 for the exam, diagnosis, procedures, and orders are all accurate and complete.

## 2021-07-09 ENCOUNTER — Telehealth: Payer: Self-pay | Admitting: Dermatology

## 2021-07-09 NOTE — Telephone Encounter (Signed)
Patient left message on office voice mail returning call. 

## 2021-07-09 NOTE — Telephone Encounter (Signed)
ST patient. Eczema getting worse, spreading; stopped using Triamcinolone because she thought it was making it worse, and she switched to Gold Bond. She thought the Clobetesol worked better but she said she was told she couldn't get it until the 13th.

## 2021-07-10 ENCOUNTER — Other Ambulatory Visit: Payer: Self-pay

## 2021-07-10 ENCOUNTER — Ambulatory Visit
Admission: EM | Admit: 2021-07-10 | Discharge: 2021-07-10 | Disposition: A | Discharge status: Home / Self Care | Payer: Medicare Other

## 2021-07-10 DIAGNOSIS — R21 Rash and other nonspecific skin eruption: Secondary | ICD-10-CM

## 2021-07-10 MED ORDER — HYDROXYZINE HCL 25 MG PO TABS
12.5000 mg | ORAL_TABLET | Freq: Three times a day (TID) | ORAL | 0 refills | Status: AC | PRN
Start: 2021-07-10 — End: ?

## 2021-07-10 MED ORDER — METHYLPREDNISOLONE ACETATE 80 MG/ML IJ SUSP
80.0000 mg | Freq: Once | INTRAMUSCULAR | Status: AC
  Administered 2021-07-10: 80 mg via INTRAMUSCULAR

## 2021-07-10 NOTE — ED Triage Notes (Signed)
Pt c/o rash to lt abdomen/side and going down her legs x1wk. States has a patch to upper back 3 wks ago and dx with eczema. States it's a tender red rash this time.

## 2021-07-10 NOTE — Telephone Encounter (Signed)
LEFT MESSAGE WITH PT HUSBAND SHE WAS A SLEEP

## 2021-07-10 NOTE — Discharge Instructions (Addendum)
You can take 1/2 tablet hydroxyzine for itching 3 times daily. At bedtime use a full tablet.

## 2021-07-10 NOTE — ED Provider Notes (Signed)
[facility]   MRN: [id number] DOB: 1945/12/27  Subjective:   Shannon Blevins is a 75 y.o. female presenting for 3-week history of persistent and worsening pruritic rash.  Initially it started over her upper arms up to her shoulders and has now spread to the rest of her torso sparing the chest area.  She has been working with her regular doctor on this.  Initially she was prescribed triamcinolone cream and then a betamethasone cream.  Was told that she had eczema.  She does have a dermatologist, has an appointment for 07/23/2021. Denies eating any new foods, starting new medications, exposure to poisonous plants, new hygiene products, new cleaning products or detergents.  Denies fever, cough, chest pain, shortness of breath, nausea, vomiting, abdominal pain, chest tightness.  Of note, patient states that it has been in the same room, he has no lesions or rash.  No current facility-administered medications for this encounter.  Current Outpatient Medications:    atenolol (TENORMIN) 50 MG tablet, Take 50 mg by mouth daily., Disp: , Rfl:    betamethasone dipropionate 0.05 % cream, APPLY  CREAM TOPICALLY TO AFFECTED AREA ONCE DAILY, Disp: 45 g, Rfl: 0   butalbital-acetaminophen-caffeine (FIORICET WITH CODEINE) 50-325-40-30 MG per capsule, Take 1 capsule by mouth every 4 (four) hours as needed for headache., Disp: , Rfl:    calcium-vitamin D (OSCAL WITH D) 250-125 MG-UNIT per tablet, Take 1 tablet by mouth daily., Disp: , Rfl:    calcium-vitamin D (OSCAL WITH D) 500-200 MG-UNIT tablet, Take 1 tablet by mouth., Disp: , Rfl:    cetirizine (ZYRTEC) 10 MG tablet, Take 10 mg by mouth daily., Disp: , Rfl:    clobetasol cream (TEMOVATE) 0.05 %, Apply topically., Disp: , Rfl:    diltiazem (DILACOR XR) 240 MG 24 hr capsule, Take 240 mg by mouth daily. , Disp: , Rfl:    docusate sodium (COLACE) 100 MG capsule, Take 100 mg by mouth 2 (two) times daily., Disp: , Rfl:    estradiol (ESTRACE) 2 MG  tablet, Take 2 mg by mouth daily., Disp: , Rfl:    ferrous fumarate (HEMOCYTE - 106 MG FE) 325 (106 FE) MG TABS, Take 1 tablet by mouth., Disp: , Rfl:    ferrous sulfate 324 MG TBEC, Take 324 mg by mouth., Disp: , Rfl:    FLUoxetine (PROZAC) 40 MG capsule, Take 40 mg by mouth daily., Disp: , Rfl:    hydrochlorothiazide (HYDRODIURIL) 12.5 MG tablet, Take 12.5 mg by mouth daily. , Disp: , Rfl:    L-Methylfolate-Algae (DEPLIN 15 PO), Take by mouth., Disp: , Rfl:    levothyroxine (SYNTHROID, LEVOTHROID) 137 MCG tablet, Take 137 mcg by mouth daily before breakfast., Disp: , Rfl:    losartan-hydrochlorothiazide (HYZAAR) 100-25 MG tablet, Take 1 tablet by mouth daily., Disp: , Rfl:    Multiple Vitamin (MULTIVITAMIN) tablet, Take 1 tablet by mouth daily., Disp: , Rfl:    Omega-3 Fatty Acids (FISH OIL) 1000 MG CAPS, Take by mouth., Disp: , Rfl:    PARoxetine (PAXIL) 20 MG tablet, Take 20 mg by mouth daily., Disp: , Rfl:    Turmeric 500 MG CAPS, Take by mouth., Disp: , Rfl:    Allergies  Allergen Reactions   Sulfa Antibiotics     Past Medical History:  Diagnosis Date   Allergy    Anxiety    Hypertension    Thyroid disease      Past Surgical History:  Procedure Laterality Date   ABDOMINAL HYSTERECTOMY  CATARACT EXTRACTION     LASIK      Family History  Problem Relation Age of Onset   Cancer Mother    Hypertension Mother    Heart disease Mother    Alcohol abuse Father    Diabetes Paternal Grandmother    Cancer Paternal Grandmother     Social History   Tobacco Use   Smoking status: Former    Packs/day: 1.00    Years: 20.00    Pack years: 20.00    Types: Cigarettes    Quit date: 2002    Years since quitting: 20.6   Smokeless tobacco: Never    ROS   Objective:   Vitals: BP 104/66 (BP Location: Left Arm)   Pulse 63   Temp 98.2 F (36.8 C) (Oral)   Resp 18   SpO2 96%   Physical Exam Constitutional:      General: She is not in acute distress.    Appearance:  Normal appearance. She is well-developed. She is not ill-appearing, toxic-appearing or diaphoretic.  HENT:     Head: Normocephalic and atraumatic.     Nose: Nose normal.     Mouth/Throat:     Mouth: Mucous membranes are moist.     Pharynx: Oropharynx is clear.  Eyes:     General: No scleral icterus.    Extraocular Movements: Extraocular movements intact.     Pupils: Pupils are equal, round, and reactive to light.  Cardiovascular:     Rate and Rhythm: Normal rate.  Pulmonary:     Effort: Pulmonary effort is normal.  Skin:    General: Skin is warm and dry.     Findings: Rash (Multiple solitary urticarial lesions coalescing into larger patches over the torso sparing the chest, similar lesions found over the upper anterior lateral extremities between mid upper arm to the shoulders) present.  Neurological:     General: No focal deficit present.     Mental Status: She is alert and oriented to person, place, and time.  Psychiatric:        Mood and Affect: Mood normal.        Behavior: Behavior normal.    Assessment and Plan :   PDMP not reviewed this encounter.  1. Rash and nonspecific skin eruption     IM Depomedrol of 80mg . Hydroxyzine for itching.  Emphasized need to monitor for any exposures at home.  Otherwise, patient does not have any signs of an anaphylactic rash.  She is generally asymptomatic, vital signs are hemodynamically stable.  Patient is stable for outpatient management with follow-up as scheduled with her dermatologist. Counseled patient on potential for adverse effects with medications prescribed/recommended today, ER and return-to-clinic precautions discussed, patient verbalized understanding.    , PA-C 07/10/21 1312

## 2021-07-10 NOTE — Telephone Encounter (Signed)
Patient aware of Dr Tafeens message 

## 2021-07-15 ENCOUNTER — Other Ambulatory Visit: Payer: Self-pay | Admitting: Dermatology

## 2021-07-22 ENCOUNTER — Ambulatory Visit: Payer: Medicare Other | Admitting: Dermatology

## 2021-07-22 ENCOUNTER — Other Ambulatory Visit: Payer: Self-pay

## 2021-07-22 DIAGNOSIS — L209 Atopic dermatitis, unspecified: Secondary | ICD-10-CM | POA: Diagnosis not present

## 2021-07-22 MED ORDER — DUPIXENT 300 MG/2ML ~~LOC~~ SOAJ: 600.0000 mg | Freq: Once | SUBCUTANEOUS | 0 refills | Status: AC

## 2021-07-22 MED ORDER — TACROLIMUS 0.1 % EX OINT: TOPICAL_OINTMENT | Freq: Two times a day (BID) | CUTANEOUS | 0 refills | Status: AC

## 2021-07-24 ENCOUNTER — Telehealth: Payer: Self-pay | Admitting: *Deleted

## 2021-07-24 NOTE — Telephone Encounter (Signed)
Prior authrization done via cover my meds.   Shannon Blevins (Key: [id number])  Your information has been submitted to [facility] [place]. [employer] [place] will review the request and notify you of the determination decision directly, typically within 3 business days of your submission and once all necessary information is received.  You will also receive your request decision electronically. To check for an update later, open the request again from your dashboard.  If [employer] [place] [employer] has not responded within the specified timeframe or if you have any questions about your PA submission, contact [employer] [place] directly at [employer]) [phone] or (PDP) [phone].

## 2021-07-24 NOTE — Telephone Encounter (Signed)
All information faxed to [facility] [phone] and [facility] [phone] for new start Dupixent

## 2021-07-25 ENCOUNTER — Encounter: Payer: Self-pay | Admitting: Dermatology

## 2021-07-25 NOTE — Progress Notes (Signed)
   Follow-Up Visit   Subjective  Shannon Blevins is a 75 y.o. female who presents for the following: Rash (Patient here for a rash. X 2 months. She thought it was her eczema coming back but she went to [facility] and they said it was a rash. They gave her steroid shot and some hydroxyzine for the itching. The rash has gotten better but it is still very itchy.  ).  Intolerable eczema. Location:  Duration:  Quality:  Associated Signs/Symptoms: Modifying Factors:  Severity:  Timing: Context:   Objective  Well appearing patient in no apparent distress; mood and affect are within normal limits. Generalized, Left Abdomen (side) - Upper, Right Abdomen (side) - Upper Husband with the patient in room throughout visit.  I spoke to patient 3-4 times while I was visiting family on the [place] 2 weeks ago.  I empathized with the misery of her itchy skin, but twice told her specifically that if she were offered prednisone pills or cortisone shot that I would have a strong preference that she avoid this.  Nonetheless, 2 weeks ago she did get a cortisone shot from a walk-in clinic with only modest improvement.  I again explained that this shot would make interpretation of patch testing daily difficult and might even affect the validity of obtaining biopsies; both of these were deferred.  Examination showed patchy but extensive subtle urticarial dermatitis particularly over the torso.  No mucositis.  No nailbed changes.  No vesicles.  Empirical diagnosis will be adult atopic dermatitis.  I did offer to arrange a second opinion from a Medical Center but she turned this down.    A focused examination was performed including head, neck, back, upper chest, abdomen, arms, legs, mucosa, lymph nodes.. Relevant physical exam findings are noted in the Assessment and Plan.   Assessment & Plan    Atopic dermatitis, unspecified type Left Abdomen (side) - Upper; Right Abdomen (side) - Upper;  Generalized  We are going to try to get Dupixent approval for patient.  Gave patient the starter dose sample per Dr.Lando Alcalde. She will follow up in 2 weeks for the next shot until we can get approval.   tacrolimus (PROTOPIC) 0.1 % ointment - Generalized, Left Abdomen (side) - Upper, Right Abdomen (side) - Upper Apply topically 2 (two) times daily.  Dupilumab (DUPIXENT) 300 MG/2ML SOPN - Generalized, Left Abdomen (side) - Upper, Right Abdomen (side) - Upper Inject 600 mg into the skin once for 1 dose. On day 1.      I, Janalyn Harder, MD, have reviewed all documentation for this visit.  The documentation on 07/25/21 for the exam, diagnosis, procedures, and orders are all accurate and complete.

## 2021-07-29 ENCOUNTER — Telehealth: Payer: Self-pay | Admitting: *Deleted

## 2021-07-29 NOTE — Telephone Encounter (Signed)
Faxed [facility] patients insurance card so they could continue processing patients Dupixent.

## 2021-07-30 ENCOUNTER — Telehealth: Payer: Self-pay | Admitting: *Deleted

## 2021-07-30 NOTE — Telephone Encounter (Signed)
Faxed over the clinical notes and insurance cards to [facility] so they can finish the prior authorization.

## 2021-07-31 NOTE — Telephone Encounter (Signed)
Per [employer] thia medication was approved 07/30/21 at 3:30 until 10/2022. Approval code [id number] phone number for PA dept. [phone] option 1

## 2021-08-06 ENCOUNTER — Ambulatory Visit: Payer: Medicare Other | Admitting: Dermatology

## 2021-08-06 ENCOUNTER — Other Ambulatory Visit: Payer: Self-pay

## 2021-08-06 ENCOUNTER — Encounter: Payer: Self-pay | Admitting: Dermatology

## 2021-08-06 DIAGNOSIS — L309 Dermatitis, unspecified: Secondary | ICD-10-CM | POA: Diagnosis not present

## 2021-08-06 MED ORDER — DUPIXENT 300 MG/2ML ~~LOC~~ SOPN
300.0000 mg | PEN_INJECTOR | SUBCUTANEOUS | 0 refills | Status: AC
Start: 2021-08-06 — End: ?

## 2021-08-11 ENCOUNTER — Encounter: Payer: Self-pay | Admitting: Dermatology

## 2021-08-11 NOTE — Progress Notes (Signed)
   Follow-Up Visit   Subjective  Shannon Blevins is a 75 y.o. female who presents for the following: Dermatitis (F/u doesn't want to do dupixent injections and rx- prototic ointment - too expensive /- husband with patient in room ).  Continued severe itching Location:  Duration:  Quality:  Associated Signs/Symptoms: Modifying Factors:  Severity:  Timing: Context:   Objective  Well appearing patient in no apparent distress; mood and affect are within normal limits. Chest (Upper Torso, Anterior) Shannon Blevins's began speaking the moment I walked in the room.  She can currently tell me that the medicine is would not work #2 extensive and that the itch was intolerable and that she would not agree to second opinion.  After listening to her, I told her how few options are available.  If she chooses to get more systemic steroids from her primary doctor or walk-in clinic, she was told I would understand but that I would not continue to follow her.  I felt strongly that a second Dupixent injection was indicated; there would be no cost and if she were not eligible for Dupixent patient assistance perhaps we could get added [place]. I highly recommend she obtain a 2nd opinion with Dr Renaldo Reel at [facility] Department of dermatology and to allow a  2nd Dupixent injection.  At this visit her husband remained virtually silent throughout.  Examination showed patchy eczema but the itching is much more widespread, perhaps indicating a primary diagnosis of cutaneous dysesthesia.    A focused examination was performed including head, neck, back, arms.. Relevant physical exam findings are noted in the Assessment and Plan.   Assessment & Plan    Dermatitis Chest (Upper Torso, Anterior)  2nd opinion with Dr Glynda Jaeger & 2nd Dupixent injection   Dupilumab (DUPIXENT) 300 MG/2ML SOPN - Chest (Upper Torso, Anterior) Inject 300 mg into the skin every 14 (fourteen) days. Starting at day 15 for maintenance.      I,  Janalyn Harder, MD, have reviewed all documentation for this visit.  The documentation on 08/11/21 for the exam, diagnosis, procedures, and orders are all accurate and complete.

## 2021-08-12 ENCOUNTER — Telehealth: Payer: Self-pay | Admitting: *Deleted

## 2021-08-12 NOTE — Telephone Encounter (Signed)
Received fax from [facility] stating missing information request- called [facility] @ [phone] to give patient phone number.

## 2021-08-12 NOTE — Telephone Encounter (Signed)
Called patient to check to see if she was going to continue with the Dupixent. Patient is not going to take Dupixent- asked me to cancel all appointments with Dr Jorja Loa because she is going to find a different dermatologist for a second opinion.

## 2021-12-10 ENCOUNTER — Ambulatory Visit: Payer: Medicare Other | Admitting: Dermatology

## 2024-11-17 ENCOUNTER — Other Ambulatory Visit: Payer: Self-pay

## 2024-11-17 ENCOUNTER — Encounter (HOSPITAL_COMMUNITY)
Admission: EM | Disposition: A | Discharge status: Home Health | Payer: Self-pay | Source: Home / Self Care | Attending: Internal Medicine

## 2024-11-17 ENCOUNTER — Inpatient Hospital Stay (HOSPITAL_COMMUNITY)

## 2024-11-17 ENCOUNTER — Emergency Department (HOSPITAL_COMMUNITY)

## 2024-11-17 ENCOUNTER — Inpatient Hospital Stay (HOSPITAL_COMMUNITY)
Admission: EM | Admit: 2024-11-17 | Discharge: 2024-11-26 | DRG: 242 | Disposition: A | Discharge status: Home Health | Attending: Internal Medicine | Admitting: Internal Medicine

## 2024-11-17 ENCOUNTER — Encounter (HOSPITAL_COMMUNITY): Payer: Self-pay

## 2024-11-17 DIAGNOSIS — E039 Hypothyroidism, unspecified: Secondary | ICD-10-CM | POA: Diagnosis present

## 2024-11-17 DIAGNOSIS — W1830XA Fall on same level, unspecified, initial encounter: Secondary | ICD-10-CM | POA: Diagnosis present

## 2024-11-17 DIAGNOSIS — Z950 Presence of cardiac pacemaker: Secondary | ICD-10-CM | POA: Insufficient documentation

## 2024-11-17 DIAGNOSIS — Z79890 Hormone replacement therapy (postmenopausal): Secondary | ICD-10-CM | POA: Diagnosis not present

## 2024-11-17 DIAGNOSIS — N184 Chronic kidney disease, stage 4 (severe): Secondary | ICD-10-CM | POA: Diagnosis present

## 2024-11-17 DIAGNOSIS — R579 Shock, unspecified: Secondary | ICD-10-CM | POA: Diagnosis not present

## 2024-11-17 DIAGNOSIS — M47812 Spondylosis without myelopathy or radiculopathy, cervical region: Secondary | ICD-10-CM | POA: Diagnosis present

## 2024-11-17 DIAGNOSIS — B9629 Other Escherichia coli [E. coli] as the cause of diseases classified elsewhere: Secondary | ICD-10-CM | POA: Diagnosis not present

## 2024-11-17 DIAGNOSIS — I959 Hypotension, unspecified: Secondary | ICD-10-CM | POA: Diagnosis not present

## 2024-11-17 DIAGNOSIS — Z882 Allergy status to sulfonamides status: Secondary | ICD-10-CM

## 2024-11-17 DIAGNOSIS — I129 Hypertensive chronic kidney disease with stage 1 through stage 4 chronic kidney disease, or unspecified chronic kidney disease: Secondary | ICD-10-CM | POA: Diagnosis present

## 2024-11-17 DIAGNOSIS — S0012XA Contusion of left eyelid and periocular area, initial encounter: Secondary | ICD-10-CM | POA: Diagnosis present

## 2024-11-17 DIAGNOSIS — N179 Acute kidney failure, unspecified: Secondary | ICD-10-CM | POA: Diagnosis present

## 2024-11-17 DIAGNOSIS — E1122 Type 2 diabetes mellitus with diabetic chronic kidney disease: Secondary | ICD-10-CM | POA: Diagnosis present

## 2024-11-17 DIAGNOSIS — I441 Atrioventricular block, second degree: Principal | ICD-10-CM

## 2024-11-17 DIAGNOSIS — I459 Conduction disorder, unspecified: Secondary | ICD-10-CM | POA: Diagnosis present

## 2024-11-17 DIAGNOSIS — Z781 Physical restraint status: Secondary | ICD-10-CM | POA: Diagnosis not present

## 2024-11-17 DIAGNOSIS — I495 Sick sinus syndrome: Secondary | ICD-10-CM | POA: Diagnosis present

## 2024-11-17 DIAGNOSIS — I4891 Unspecified atrial fibrillation: Secondary | ICD-10-CM | POA: Diagnosis not present

## 2024-11-17 DIAGNOSIS — B9620 Unspecified Escherichia coli [E. coli] as the cause of diseases classified elsewhere: Secondary | ICD-10-CM | POA: Diagnosis present

## 2024-11-17 DIAGNOSIS — G9349 Other encephalopathy: Secondary | ICD-10-CM | POA: Diagnosis not present

## 2024-11-17 DIAGNOSIS — R112 Nausea with vomiting, unspecified: Secondary | ICD-10-CM | POA: Diagnosis not present

## 2024-11-17 DIAGNOSIS — I442 Atrioventricular block, complete: Principal | ICD-10-CM | POA: Diagnosis present

## 2024-11-17 DIAGNOSIS — D631 Anemia in chronic kidney disease: Secondary | ICD-10-CM | POA: Diagnosis present

## 2024-11-17 DIAGNOSIS — G9341 Metabolic encephalopathy: Secondary | ICD-10-CM | POA: Diagnosis present

## 2024-11-17 DIAGNOSIS — N390 Urinary tract infection, site not specified: Secondary | ICD-10-CM | POA: Diagnosis present

## 2024-11-17 DIAGNOSIS — D509 Iron deficiency anemia, unspecified: Secondary | ICD-10-CM | POA: Diagnosis present

## 2024-11-17 DIAGNOSIS — W19XXXA Unspecified fall, initial encounter: Secondary | ICD-10-CM

## 2024-11-17 DIAGNOSIS — J45909 Unspecified asthma, uncomplicated: Secondary | ICD-10-CM | POA: Diagnosis not present

## 2024-11-17 DIAGNOSIS — Z79899 Other long term (current) drug therapy: Secondary | ICD-10-CM

## 2024-11-17 DIAGNOSIS — Z8249 Family history of ischemic heart disease and other diseases of the circulatory system: Secondary | ICD-10-CM

## 2024-11-17 DIAGNOSIS — E860 Dehydration: Secondary | ICD-10-CM | POA: Diagnosis present

## 2024-11-17 DIAGNOSIS — Z87891 Personal history of nicotine dependence: Secondary | ICD-10-CM

## 2024-11-17 DIAGNOSIS — E872 Acidosis, unspecified: Secondary | ICD-10-CM | POA: Diagnosis present

## 2024-11-17 DIAGNOSIS — G8929 Other chronic pain: Secondary | ICD-10-CM | POA: Diagnosis present

## 2024-11-17 DIAGNOSIS — R5381 Other malaise: Secondary | ICD-10-CM | POA: Diagnosis not present

## 2024-11-17 DIAGNOSIS — I10 Essential (primary) hypertension: Secondary | ICD-10-CM

## 2024-11-17 DIAGNOSIS — R001 Bradycardia, unspecified: Secondary | ICD-10-CM | POA: Diagnosis present

## 2024-11-17 DIAGNOSIS — G9340 Encephalopathy, unspecified: Secondary | ICD-10-CM | POA: Diagnosis not present

## 2024-11-17 DIAGNOSIS — R571 Hypovolemic shock: Secondary | ICD-10-CM | POA: Diagnosis not present

## 2024-11-17 DIAGNOSIS — E785 Hyperlipidemia, unspecified: Secondary | ICD-10-CM | POA: Diagnosis present

## 2024-11-17 DIAGNOSIS — R197 Diarrhea, unspecified: Secondary | ICD-10-CM | POA: Diagnosis not present

## 2024-11-17 DIAGNOSIS — F32A Depression, unspecified: Secondary | ICD-10-CM | POA: Diagnosis present

## 2024-11-17 DIAGNOSIS — E876 Hypokalemia: Secondary | ICD-10-CM | POA: Diagnosis present

## 2024-11-17 DIAGNOSIS — E861 Hypovolemia: Secondary | ICD-10-CM | POA: Diagnosis not present

## 2024-11-17 DIAGNOSIS — Z833 Family history of diabetes mellitus: Secondary | ICD-10-CM

## 2024-11-17 HISTORY — PX: TEMPORARY PACEMAKER: CATH118268

## 2024-11-17 HISTORY — DX: Disorder of kidney and ureter, unspecified: N28.9

## 2024-11-17 LAB — COMPREHENSIVE METABOLIC PANEL WITH GFR
ALT: 16 U/L (ref 0–44)
AST: 18 U/L (ref 15–41)
Albumin: 3.4 g/dL — ABNORMAL LOW (ref 3.5–5.0)
Alkaline Phosphatase: 51 U/L (ref 38–126)
Anion gap: 20 — ABNORMAL HIGH (ref 5–15)
BUN: 124 mg/dL — ABNORMAL HIGH (ref 8–23)
CO2: 10 mmol/L — ABNORMAL LOW (ref 22–32)
Calcium: 8.5 mg/dL — ABNORMAL LOW (ref 8.9–10.3)
Chloride: 105 mmol/L (ref 98–111)
Creatinine, Ser: 10.5 mg/dL — ABNORMAL HIGH (ref 0.44–1.00)
GFR, Estimated: 3 mL/min — ABNORMAL LOW (ref >60)
Glucose, Bld: 108 mg/dL — ABNORMAL HIGH (ref 70–99)
Potassium: 4 mmol/L (ref 3.5–5.1)
Sodium: 134 mmol/L — ABNORMAL LOW (ref 135–145)
Total Bilirubin: 0.2 mg/dL (ref 0.0–1.2)
Total Protein: 5.9 g/dL — ABNORMAL LOW (ref 6.5–8.1)

## 2024-11-17 LAB — CREATININE, SERUM
Creatinine, Ser: 9.5 mg/dL — ABNORMAL HIGH (ref 0.44–1.00)
GFR, Estimated: 4 mL/min — ABNORMAL LOW (ref >60)

## 2024-11-17 LAB — I-STAT CHEM 8, ED
BUN: >130 mg/dL — ABNORMAL HIGH (ref 8–23)
Calcium, Ion: 1.07 mmol/L — ABNORMAL LOW (ref 1.15–1.40)
Chloride: 111 mmol/L (ref 98–111)
Creatinine, Ser: 11.5 mg/dL — ABNORMAL HIGH (ref 0.44–1.00)
Glucose, Bld: 109 mg/dL — ABNORMAL HIGH (ref 70–99)
HCT: 27 % — ABNORMAL LOW (ref 36.0–46.0)
Hemoglobin: 9.2 g/dL — ABNORMAL LOW (ref 12.0–15.0)
Potassium: 3.9 mmol/L (ref 3.5–5.1)
Sodium: 136 mmol/L (ref 135–145)
TCO2: 11 mmol/L — ABNORMAL LOW (ref 22–32)

## 2024-11-17 LAB — CBC WITH DIFFERENTIAL/PLATELET
Abs Immature Granulocytes: 0.05 K/uL (ref 0.00–0.07)
Basophils Absolute: 0 K/uL (ref 0.0–0.1)
Basophils Relative: 0 %
Eosinophils Absolute: 0 K/uL (ref 0.0–0.5)
Eosinophils Relative: 0 %
HCT: 30.9 % — ABNORMAL LOW (ref 36.0–46.0)
Hemoglobin: 10 g/dL — ABNORMAL LOW (ref 12.0–15.0)
Immature Granulocytes: 1 %
Lymphocytes Relative: 21 %
Lymphs Abs: 1.7 K/uL (ref 0.7–4.0)
MCH: 31.1 pg (ref 26.0–34.0)
MCHC: 32.4 g/dL (ref 30.0–36.0)
MCV: 96 fL (ref 80.0–100.0)
Monocytes Absolute: 0.6 K/uL (ref 0.1–1.0)
Monocytes Relative: 8 %
Neutro Abs: 5.6 K/uL (ref 1.7–7.7)
Neutrophils Relative %: 70 %
Platelets: 255 K/uL (ref 150–400)
RBC: 3.22 MIL/uL — ABNORMAL LOW (ref 3.87–5.11)
RDW: 13.2 % (ref 11.5–15.5)
WBC: 8 K/uL (ref 4.0–10.5)
nRBC: 0 % (ref 0.0–0.2)

## 2024-11-17 LAB — CBC
HCT: 30.7 % — ABNORMAL LOW (ref 36.0–46.0)
Hemoglobin: 10.1 g/dL — ABNORMAL LOW (ref 12.0–15.0)
MCH: 30.1 pg (ref 26.0–34.0)
MCHC: 32.9 g/dL (ref 30.0–36.0)
MCV: 91.4 fL (ref 80.0–100.0)
Platelets: 344 K/uL (ref 150–400)
RBC: 3.36 MIL/uL — ABNORMAL LOW (ref 3.87–5.11)
RDW: 13.2 % (ref 11.5–15.5)
WBC: 10.6 K/uL — ABNORMAL HIGH (ref 4.0–10.5)
nRBC: 0 % (ref 0.0–0.2)

## 2024-11-17 LAB — ECHOCARDIOGRAM COMPLETE
Calc EF: 53 %
S' Lateral: 3 cm
Single Plane A2C EF: 50.5 %
Single Plane A4C EF: 50.4 %

## 2024-11-17 LAB — PRO BRAIN NATRIURETIC PEPTIDE: Pro Brain Natriuretic Peptide: 2351 pg/mL — ABNORMAL HIGH (ref <300.0)

## 2024-11-17 LAB — MRSA NEXT GEN BY PCR, NASAL: MRSA by PCR Next Gen: NOT DETECTED

## 2024-11-17 LAB — MAGNESIUM
Magnesium: 1.7 mg/dL (ref 1.7–2.4)
Magnesium: 1.6 mg/dL — ABNORMAL LOW (ref 1.7–2.4)

## 2024-11-17 LAB — TSH: TSH: 2.42 u[IU]/mL (ref 0.350–4.500)

## 2024-11-17 LAB — PHOSPHORUS: Phosphorus: 7.7 mg/dL — ABNORMAL HIGH (ref 2.5–4.6)

## 2024-11-17 LAB — T4, FREE: Free T4: 0.88 ng/dL (ref 0.80–2.00)

## 2024-11-17 MED ORDER — FERROUS SULFATE 325 (65 FE) MG PO TABS
324.0000 mg | ORAL_TABLET | Freq: Every day | ORAL | Status: DC
  Filled 2024-11-17: qty 1

## 2024-11-17 MED ORDER — FLUOXETINE HCL 20 MG PO CAPS: 40.0000 mg | ORAL_CAPSULE | Freq: Every day | ORAL | Status: DC

## 2024-11-17 MED ORDER — LEVOTHYROXINE SODIUM 50 MCG PO TABS
137.0000 ug | ORAL_TABLET | Freq: Every day | ORAL | Status: DC
  Administered 2024-11-18 – 2024-11-26 (×9): 137 ug via ORAL
  Filled 2024-11-17 (×10): qty 1

## 2024-11-17 MED ORDER — ATROPINE SULFATE 1 MG/10ML IJ SOSY
PREFILLED_SYRINGE | INTRAMUSCULAR | Status: AC
  Filled 2024-11-17: qty 10

## 2024-11-17 MED ORDER — DOCUSATE SODIUM 100 MG PO CAPS
100.0000 mg | ORAL_CAPSULE | Freq: Two times a day (BID) | ORAL | Status: DC | PRN
  Administered 2024-11-24 – 2024-11-26 (×2): 100 mg via ORAL
  Filled 2024-11-17 (×2): qty 1

## 2024-11-17 MED ORDER — CHLORHEXIDINE GLUCONATE CLOTH 2 % EX PADS
6.0000 | MEDICATED_PAD | Freq: Every day | CUTANEOUS | Status: DC
  Administered 2024-11-17 – 2024-11-24 (×8): 6 via TOPICAL

## 2024-11-17 MED ORDER — LACTATED RINGERS IV SOLN: INTRAVENOUS | Status: DC

## 2024-11-17 MED ORDER — SODIUM CHLORIDE 0.9 % IV SOLN
INTRAVENOUS | Status: AC | PRN
  Administered 2024-11-17: 15:00:00 10 mL/h via INTRAVENOUS

## 2024-11-17 MED ORDER — NOREPINEPHRINE 4 MG/250ML-% IV SOLN
INTRAVENOUS | Status: AC
  Filled 2024-11-17: qty 250

## 2024-11-17 MED ORDER — NOREPINEPHRINE BITARTRATE 1 MG/ML IV SOLN
INTRAVENOUS | Status: AC | PRN
  Administered 2024-11-17: 16:00:00 5 ug/min via INTRAVENOUS

## 2024-11-17 MED ORDER — OLANZAPINE 10 MG IM SOLR
2.5000 mg | Freq: Once | INTRAMUSCULAR | Status: AC | PRN
  Administered 2024-11-17: 23:00:00 2.5 mg via INTRAMUSCULAR
  Filled 2024-11-17: qty 10

## 2024-11-17 MED ORDER — LIDOCAINE HCL (PF) 1 % IJ SOLN
INTRAMUSCULAR | Status: AC
  Filled 2024-11-17: qty 30

## 2024-11-17 MED ORDER — SODIUM CHLORIDE 0.9 % IV BOLUS
1000.0000 mL | Freq: Once | INTRAVENOUS | Status: AC
  Administered 2024-11-17: 14:00:00 1000 mL via INTRAVENOUS

## 2024-11-17 MED ORDER — HEPARIN SODIUM (PORCINE) 5000 UNIT/ML IJ SOLN
5000.0000 [IU] | Freq: Three times a day (TID) | INTRAMUSCULAR | Status: DC
  Administered 2024-11-17 – 2024-11-26 (×26): 5000 [IU] via SUBCUTANEOUS
  Filled 2024-11-17 (×26): qty 1

## 2024-11-17 MED ORDER — ONDANSETRON HCL 4 MG/2ML IJ SOLN
4.0000 mg | Freq: Four times a day (QID) | INTRAMUSCULAR | Status: DC | PRN
  Administered 2024-11-17 – 2024-11-26 (×3): 4 mg via INTRAVENOUS
  Filled 2024-11-17 (×3): qty 2

## 2024-11-17 MED ORDER — ATROPINE SULFATE 1 MG/10ML IJ SOSY
1.0000 mg | PREFILLED_SYRINGE | Freq: Once | INTRAMUSCULAR | Status: AC
  Administered 2024-11-17: 14:00:00 1 mg via INTRAVENOUS

## 2024-11-17 MED ORDER — POLYETHYLENE GLYCOL 3350 17 G PO PACK
17.0000 g | PACK | Freq: Every day | ORAL | Status: DC | PRN
  Administered 2024-11-24: 17 g via ORAL
  Filled 2024-11-17: qty 1

## 2024-11-17 MED ORDER — LIDOCAINE HCL (PF) 1 % IJ SOLN
INTRAMUSCULAR | Status: DC | PRN
  Administered 2024-11-17: 15:00:00 10 mL

## 2024-11-17 MED ORDER — PAROXETINE HCL 20 MG PO TABS
20.0000 mg | ORAL_TABLET | Freq: Every day | ORAL | Status: DC
  Administered 2024-11-18 – 2024-11-26 (×9): 20 mg via ORAL
  Filled 2024-11-17 (×9): qty 1

## 2024-11-17 MED ORDER — STERILE WATER FOR INJECTION IV SOLN
INTRAMUSCULAR | Status: AC
  Filled 2024-11-17 (×2): qty 1000

## 2024-11-17 NOTE — Progress Notes (Signed)
°  Echocardiogram 2D Echocardiogram has been performed.  Shannon Blevins 11/17/2024, 4:49 PM

## 2024-11-17 NOTE — Progress Notes (Signed)
 eLink Physician-Brief Progress Note Patient Name: Shannon Blevins DOB: 1946-11-18 MRN: [id number]   Date of Service  11/17/2024  HPI/Events of Note  Patient admitted with symptomatic bradycardia, hypotension, acute on chronic renal failure, uremic encephalopathy. 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  eICU Interventions  New Patient Evaluation.        Nasim Habeeb U Jemiah [facility]ra 11/17/2024, 7:47 PM

## 2024-11-17 NOTE — H&P (Signed)
 NAME:  Shannon Blevins, MRN:  [id number], DOB:  10/04/1946, LOS: 0 ADMISSION DATE:  11/17/2024, CONSULTATION DATE:  12/18 REFERRING MD:  Patsey, EDP CHIEF COMPLAINT:  bradycardia   History of Present Illness:  78 year old female with past medical history of asthma, hypertension, hypothyroidism, depression, iron deficiency anemia, hyperlipidemia who presents to the ER after fall. Daughter called EMS after patient fall at home, striking her face. Noted several days of nausea, vomiting, diarrhea. Daughter also notes more confusion over the past few weeks. In ED found to have heart blood with HR 30s, hypotensive. Was given 1mg  atropine  with no improvement. Given IVF. Cardiology consulted and taking to cath lab for TVP. CCM asked to admit.   Of note I-stat chem 8 w/ BUN >130, sCr 11.5. 2 weeks ago her labs showed BUN 14, sCr 3.05.   Pertinent  Medical History  Hypertension, HLD, hypothyroid, depression, IDA, asthma   Significant Hospital Events: Including procedures, antibiotic start and stop dates in addition to other pertinent events   12/18: admit for CHB, going for TVP  Interim History / Subjective:  Feels okay on exam, mildly dizzy complaining of right neck pain   Objective   Blood pressure (!) 96/41, resp. rate 11.       No intake or output data in the 24 hours ending 11/17/24 1503 There were no vitals filed for this visit.  Examination: General: older female laying on stretcher  HENT: left eye ecchymosis, room air  Lungs: resp even and unlabored on room air  Cardiovascular: bradycardia, irregular  Abdomen: soft Extremities: warm, no edema  Neuro: awake, alert, oriented, moves all extremities  GU: no foley   Resolved Hospital Problem list    Assessment & Plan:  Symptomatic bradycardia  - cardiology consulted, taking for TVP  - unclear if this is renal failure mediated, K is normal despite acute renal failure  - check magnesium, phos  - check bnp, and lactate  - echo   - hold all antihypertensives and rate control medications - on diltiazem, atenolol, hydrochlorothiazide, hyzaar  - tele monitoring   Acute renal failure  Uremic encephalopathy  Nausea, vomiting, diarrhea  2 weeks ago her labs showed BUN 14, sCr 3.05. suspect her days of n/v/d are related to worsening renal failure and uremia. Also suspect his caused her fall. Query pre-renal hypovolemia r/t GI losses on known CKD.  - nephrology consult  - 1L IVF now and see if UOP picks up  - suspect n/v/d  - insert foley, UA  - trend bmp, mag, phos - replete elytes - strict I&O - Avoid nephrotoxic agents, renally dose medications - ensure adequate renal perfusion   Fall  Multifactorial likely r/t CHB as well as significant uremia  CT head and c-spine negative  - have eval by PT when appropriate   Iron deficiency anemia  - count stable on admit  - restart when taking PO   HTN HLD - not on statin  - no antiHTN at current   Asthma  - no inhalers at home  - clinically monitor   Hypothyroid - check TSH and T4/T3  - restart synthroid  tomorrow   Labs   CBC: Recent Labs  Lab 11/17/24 1427 11/17/24 1433  WBC 8.0  --   NEUTROABS 5.6  --   HGB 10.0* 9.2*  HCT 30.9* 27.0*  MCV 96.0  --   PLT 255  --     Basic Metabolic Panel: Recent Labs  Lab 11/17/24 1433  NA  136  K 3.9  CL 111  GLUCOSE 109*  BUN >130*  CREATININE 11.50*   GFR: CrCl cannot be calculated (Unknown ideal weight.). Recent Labs  Lab 11/17/24 1427  WBC 8.0    Liver Function Tests: No results for input(s): AST, ALT, ALKPHOS, BILITOT, PROT, ALBUMIN in the last 168 hours. No results for input(s): LIPASE, AMYLASE in the last 168 hours. No results for input(s): AMMONIA in the last 168 hours.  ABG    Component Value Date/Time   TCO2 11 (L) 11/17/2024 1433     Coagulation Profile: No results for input(s): INR, PROTIME in the last 168 hours.  Cardiac Enzymes: No results for  input(s): CKTOTAL, CKMB, CKMBINDEX, TROPONINI in the last 168 hours.  HbA1C: No results found for: HGBA1C  CBG: No results for input(s): GLUCAP in the last 168 hours.  Review of Systems:   As above   Past Medical History:  She,  has a past medical history of Allergy, Anxiety, Hypertension, Renal disorder, and Thyroid disease.   Surgical History:   Past Surgical History:  Procedure Laterality Date   ABDOMINAL HYSTERECTOMY     CATARACT EXTRACTION     LASIK       Social History:   reports that she quit smoking about 23 years ago. Her smoking use included cigarettes. She started smoking about 43 years ago. She has a 20 pack-year smoking history. She has never used smokeless tobacco.   Family History:  Her family history includes Alcohol abuse in her father; Cancer in her mother and paternal grandmother; Diabetes in her paternal grandmother; Heart disease in her mother; Hypertension in her mother.   Allergies Allergies[1]   Home Medications  Prior to Admission medications  Medication Sig Start Date End Date Taking? Authorizing Provider  atenolol (TENORMIN) 50 MG tablet Take 50 mg by mouth daily.    [provider]  betamethasone  dipropionate 0.05 % cream APPLY  CREAM TOPICALLY TO AFFECTED AREA ONCE DAILY 07/16/21   Livingston Rigg, MD  butalbital-acetaminophen -caffeine (FIORICET WITH CODEINE) 50-325-40-30 MG per capsule Take 1 capsule by mouth every 4 (four) hours as needed for headache.    [provider]  calcium -vitamin D (OSCAL WITH D) 250-125 MG-UNIT per tablet Take 1 tablet by mouth daily.    [provider]  calcium -vitamin D (OSCAL WITH D) 500-200 MG-UNIT tablet Take 1 tablet by mouth.    [provider]  cetirizine (ZYRTEC) 10 MG tablet Take 10 mg by mouth daily.    [provider]  clobetasol cream (TEMOVATE) 0.05 % Apply topically. 06/28/21   [provider]  diltiazem (DILACOR XR) 240 MG 24 hr capsule Take  240 mg by mouth daily.     [provider]  docusate sodium  (COLACE) 100 MG capsule Take 100 mg by mouth 2 (two) times daily.    [provider]  Dupilumab  (DUPIXENT ) 300 MG/2ML SOPN Inject 300 mg into the skin every 14 (fourteen) days. Starting at day 15 for maintenance. 08/06/21   Livingston Rigg, MD  estradiol (ESTRACE) 2 MG tablet Take 2 mg by mouth daily.    [provider]  ferrous fumarate (HEMOCYTE - 106 MG FE) 325 (106 FE) MG TABS Take 1 tablet by mouth.    [provider]  ferrous sulfate  324 MG TBEC Take 324 mg by mouth.    [provider]  FLUoxetine  (PROZAC ) 40 MG capsule Take 40 mg by mouth daily.    [provider]  hydrochlorothiazide (HYDRODIURIL) 12.5  MG tablet Take 12.5 mg by mouth daily.     [provider]  hydrOXYzine  (ATARAX /VISTARIL ) 25 MG tablet Take 0.5-1 tablets (12.5-25 mg total) by mouth every 8 (eight) hours as needed for itching. 07/10/21   Christopher Savannah, PA-C  L-Methylfolate-Algae (DEPLIN 15 PO) Take by mouth.    [provider]  levothyroxine  (SYNTHROID , LEVOTHROID) 137 MCG tablet Take 137 mcg by mouth daily before breakfast.    [provider]  losartan-hydrochlorothiazide (HYZAAR) 100-25 MG tablet Take 1 tablet by mouth daily. 06/17/20   [provider]  Multiple Vitamin (MULTIVITAMIN) tablet Take 1 tablet by mouth daily.    [provider]  Omega-3 Fatty Acids (FISH OIL) 1000 MG CAPS Take by mouth.    [provider]  PARoxetine  (PAXIL ) 20 MG tablet Take 20 mg by mouth daily. 07/09/20   [provider]  tacrolimus  (PROTOPIC ) 0.1 % ointment Apply topically 2 (two) times daily. 07/22/21   Tafeen, Stuart, MD  Turmeric 500 MG CAPS Take by mouth.    [provider]     Critical care time: 76   Tinnie FORBES Adolph [facility]ONNA [facility] 11/17/2024 3:27 PM  Please see [email or web address] for pager details.  From 7A-7P if no response, please  call [phone] After hours, please call ELink [phone]          [1]  Allergies Allergen Reactions   Sulfa Antibiotics

## 2024-11-17 NOTE — ED Provider Notes (Signed)
 [facility] CARDIAC CATH LAB Provider Note   CSN: [id number] Arrival date & time: 11/17/24  1355     Patient presents with: No chief complaint on file.   Shannon Blevins is a 78 y.o. female.   HPI Patient presents with bradycardia and hypotension.  Came from home.  Reportedly EMS was called for the patient's husband but the patient's daughter wanted patient checked out while they were there.  Found to have a heart rate in the 30s.  Was hypotensive.  Reportedly has not left the house in years.  Has had nausea vomiting diarrhea last couple days.  Upon arrival found to be in heart block.  Pressures low.  However is awake and talking but cannot really provide much history.   Past Medical History:  Diagnosis Date   Allergy    Anxiety    Hypertension    Renal disorder    Thyroid disease     Prior to Admission medications  Medication Sig Start Date End Date Taking? Authorizing Provider  atenolol (TENORMIN) 50 MG tablet Take 50 mg by mouth daily.    [provider]  betamethasone  dipropionate 0.05 % cream APPLY  CREAM TOPICALLY TO AFFECTED AREA ONCE DAILY 07/16/21   Tafeen, Stuart, MD  butalbital-acetaminophen-caffeine (FIORICET WITH CODEINE) 50-325-40-30 MG per capsule Take 1 capsule by mouth every 4 (four) hours as needed for headache.    [provider]  calcium-vitamin D (OSCAL WITH D) 250-125 MG-UNIT per tablet Take 1 tablet by mouth daily.    [provider]  calcium-vitamin D (OSCAL WITH D) 500-200 MG-UNIT tablet Take 1 tablet by mouth.    [provider]  cetirizine (ZYRTEC) 10 MG tablet Take 10 mg by mouth daily.    [provider]  clobetasol cream (TEMOVATE) 0.05 % Apply topically. 06/28/21   [provider]  diltiazem (DILACOR XR) 240 MG 24 hr capsule Take 240 mg by mouth daily.     [provider]  docusate sodium  (COLACE) 100 MG capsule Take 100 mg by mouth 2 (two) times daily.    [provider]  Dupilumab  (DUPIXENT ) 300 MG/2ML SOPN Inject 300 mg into the skin every 14 (fourteen) days. Starting at day 15 for maintenance. 08/06/21   Livingston Rigg, MD  estradiol (ESTRACE) 2 MG tablet Take 2 mg by mouth daily.    [provider]  ferrous fumarate (HEMOCYTE - 106 MG FE) 325 (106 FE) MG TABS Take 1 tablet by mouth.    [provider]  ferrous sulfate  324 MG TBEC Take 324 mg by mouth.    [provider]  FLUoxetine  (PROZAC ) 40 MG capsule Take 40 mg by mouth daily.    [provider]  hydrochlorothiazide (HYDRODIURIL) 12.5 MG tablet Take 12.5 mg by mouth daily.     [provider]  hydrOXYzine  (ATARAX /VISTARIL ) 25 MG tablet Take 0.5-1 tablets (12.5-25 mg total) by mouth every 8 (eight) hours as needed for itching. 07/10/21   Christopher Savannah, PA-C  L-Methylfolate-Algae (DEPLIN 15 PO) Take by mouth.    [provider]  levothyroxine  (SYNTHROID , LEVOTHROID) 137 MCG tablet Take 137 mcg by mouth daily before breakfast.    [provider]  losartan-hydrochlorothiazide (HYZAAR) 100-25 MG tablet Take 1 tablet by mouth daily. 06/17/20   [provider]  Multiple Vitamin (MULTIVITAMIN) tablet Take 1 tablet by mouth daily.    [provider]  Omega-3 Fatty Acids (FISH OIL) 1000 MG CAPS Take by mouth.  [provider]  PARoxetine  (PAXIL ) 20 MG tablet Take 20 mg by mouth daily. 07/09/20   [provider]  tacrolimus  (PROTOPIC ) 0.1 % ointment Apply topically 2 (two) times daily. 07/22/21   Tafeen, Stuart, MD  Turmeric 500 MG CAPS Take by mouth.    [provider]    Allergies: Sulfa antibiotics    Review of Systems  Updated Vital Signs BP (!) 72/51   Pulse 70   Resp 14   SpO2 90%   Physical Exam Vitals and nursing note reviewed.  HENT:     Head:     Comments: Periorbital hematoma on left.  No severe tenderness. Cardiovascular:     Rate and Rhythm: Bradycardia present. Rhythm  irregular.  Chest:     Chest wall: No tenderness.  Abdominal:     Tenderness: There is no abdominal tenderness.  Neurological:     Comments: Awake and pleasant with some confusion.  Cannot provide much history.     (all labs ordered are listed, but only abnormal results are displayed) Labs Reviewed  COMPREHENSIVE METABOLIC PANEL WITH GFR - Abnormal; Notable for the following components:      Result Value   Sodium 134 (*)    CO2 10 (*)    Glucose, Bld 108 (*)    BUN 124 (*)    Creatinine, Ser 10.50 (*)    Calcium 8.5 (*)    Total Protein 5.9 (*)    Albumin 3.4 (*)    GFR, Estimated 3 (*)    Anion gap 20 (*)    All other components within normal limits  CBC WITH DIFFERENTIAL/PLATELET - Abnormal; Notable for the following components:   RBC 3.22 (*)    Hemoglobin 10.0 (*)    HCT 30.9 (*)    All other components within normal limits  I-STAT CHEM 8, ED - Abnormal; Notable for the following components:   BUN >130 (*)    Creatinine, Ser 11.50 (*)    Glucose, Bld 109 (*)    Calcium, Ion 1.07 (*)    TCO2 11 (*)    Hemoglobin 9.2 (*)    HCT 27.0 (*)    All other components within normal limits  MRSA NEXT GEN BY PCR, NASAL  TSH  MAGNESIUM  CBC  CREATININE, SERUM  MAGNESIUM  PHOSPHORUS  PRO BRAIN NATRIURETIC PEPTIDE  URINALYSIS, ROUTINE W REFLEX MICROSCOPIC  T4, FREE  T3  I-STAT CG4 LACTIC ACID, ED  I-STAT CG4 LACTIC ACID, ED    EKG: EKG Interpretation Date/Time:  Thursday November 17 2024 14:01:41 EST Ventricular Rate:  39 PR Interval:  226 QRS Duration:  134 QT Interval:  535 QTC Calculation: 431 R Axis:   -9  Text Interpretation: Bradycardia with irregular rate possible 2:1 heart block but  somewhat difficult  to determine P waves Borderline prolonged PR interval Nonspecific intraventricular conduction delay Borderline T abnormalities, inferior leads Confirmed by Patsey Lot [phone]) on 11/17/2024 2:08:00 PM  Radiology: CARDIAC CATHETERIZATION Result  Date: 11/17/2024 Successful placement of RIJ temporary pacing catheter   CT HEAD WO CONTRAST ( ) Result Date: 11/17/2024 EXAM: CT HEAD AND CERVICAL SPINE 11/17/2024 02:45:07 PM TECHNIQUE: CT of the head and cervical spine was performed without the administration of intravenous contrast. Multiplanar reformatted images are provided for review. Automated exposure control, iterative reconstruction, and/or weight based adjustment of the mA/kV was utilized to reduce the radiation dose to as low as reasonably achievable. COMPARISON: None available. CLINICAL HISTORY: Head trauma, moderate-severe. FINDINGS: CT HEAD BRAIN  AND VENTRICLES: Hypoattenuating foci in the cerebral white matter, most likely representing chronic small vessel disease. Prominence of the sulci and ventricles is compatible with brain atrophy. No acute intracranial hemorrhage. No mass effect or midline shift. No abnormal extra-axial fluid collection. No evidence of acute infarct. No hydrocephalus. ORBITS: No acute abnormality. SINUSES AND MASTOIDS: No acute abnormality. SOFT TISSUES AND SKULL: Small superficial soft tissue contusion within the left supraorbital region. No acute skull fracture. CT CERVICAL SPINE BONES AND ALIGNMENT: Stepwise 1st degree anterolisthesis of C4 on C5, C5 on C6, and C6 on C7. No acute fracture or traumatic malalignment. DEGENERATIVE CHANGES: Multilevel disc space narrowing and endplate spurring noted. SOFT TISSUES: No prevertebral soft tissue swelling. IMPRESSION: 1. No acute intracranial abnormality. 2. Chronic small vessel ischemic disease and brain atrophy. 3. Small superficial soft tissue contusion within the left supraorbital region. 4. No acute cervical spine fracture or traumatic malalignment. 5. Cervical spondylosis. Electronically signed by: Waddell Calk MD 11/17/2024 03:19 PM EST RP Workstation: [id number]   CT Cervical Spine Wo Contrast Result Date: 11/17/2024 EXAM: CT HEAD AND CERVICAL SPINE 11/17/2024  02:45:07 PM TECHNIQUE: CT of the head and cervical spine was performed without the administration of intravenous contrast. Multiplanar reformatted images are provided for review. Automated exposure control, iterative reconstruction, and/or weight based adjustment of the mA/kV was utilized to reduce the radiation dose to as low as reasonably achievable. COMPARISON: None available. CLINICAL HISTORY: Head trauma, moderate-severe. FINDINGS: CT HEAD BRAIN AND VENTRICLES: Hypoattenuating foci in the cerebral white matter, most likely representing chronic small vessel disease. Prominence of the sulci and ventricles is compatible with brain atrophy. No acute intracranial hemorrhage. No mass effect or midline shift. No abnormal extra-axial fluid collection. No evidence of acute infarct. No hydrocephalus. ORBITS: No acute abnormality. SINUSES AND MASTOIDS: No acute abnormality. SOFT TISSUES AND SKULL: Small superficial soft tissue contusion within the left supraorbital region. No acute skull fracture. CT CERVICAL SPINE BONES AND ALIGNMENT: Stepwise 1st degree anterolisthesis of C4 on C5, C5 on C6, and C6 on C7. No acute fracture or traumatic malalignment. DEGENERATIVE CHANGES: Multilevel disc space narrowing and endplate spurring noted. SOFT TISSUES: No prevertebral soft tissue swelling. IMPRESSION: 1. No acute intracranial abnormality. 2. Chronic small vessel ischemic disease and brain atrophy. 3. Small superficial soft tissue contusion within the left supraorbital region. 4. No acute cervical spine fracture or traumatic malalignment. 5. Cervical spondylosis. Electronically signed by: Waddell Calk MD 11/17/2024 03:19 PM EST RP Workstation: [id number]     Procedures   Medications Ordered in the ED  lidocaine  (PF) (XYLOCAINE ) 1 % injection (10 mLs Infiltration Given 11/17/24 1505)  Chlorhexidine  Gluconate Cloth 2 % PADS 6 each (has no administration in time range)  docusate sodium  (COLACE) capsule 100 mg (has no  administration in time range)  polyethylene glycol (MIRALAX  / GLYCOLAX ) packet 17 g (has no administration in time range)  heparin  injection 5,000 Units (has no administration in time range)  ondansetron  (ZOFRAN ) injection 4 mg (has no administration in time range)  lactated ringers  infusion (has no administration in time range)  sodium bicarbonate 150 mEq in sterile water  1,150 mL infusion (has no administration in time range)  ferrous sulfate  EC tablet 324 mg (has no administration in time range)  FLUoxetine  (PROZAC ) capsule 40 mg (has no administration in time range)  levothyroxine  (SYNTHROID ) tablet 137 mcg (has no administration in time range)  PARoxetine  (PAXIL ) tablet 20 mg (has no administration in time range)  norepinephrine  (LEVOPHED ) 4 mg in dextrose  5 %  250 mL (0.016 mg/mL) infusion (5 mcg/min Intravenous New Bag/Given 11/17/24 1530)  0.9 %  sodium chloride  infusion (10 mL/hr Intravenous New Bag/Given 11/17/24 1527)  atropine  1 MG/10ML injection 1 mg (1 mg Intravenous Given 11/17/24 1412)  sodium chloride  0.9 % bolus 1,000 mL (1,000 mLs Intravenous New Bag/Given 11/17/24 1423)                                    Medical Decision Making Amount and/or Complexity of Data Reviewed Labs: ordered. Radiology: ordered.  Risk Prescription drug management.   Patient presents with bradycardia and hypotension.  Differential diagnoses longed includes causes such as sepsis, dehydration.  Also arrhythmia.  Appears to be in a heart block.  2-1 heart block comes and goes.  Does have low blood pressure but is awake and speaking.  Potentially could be due to to the arrhythmia or the dehydration.  Is on atenolol.  Discussed with Dr. Jeffrie from cardiology.  New renal failure discovered.  Creatinine up to 11.  With the renal failure patient will get cardiac pacer wire placed.  Also discussed with Dr. Claudene from ICU who will admit patient.   CRITICAL CARE Performed by: [place] Total critical care time: 30 minutes Critical care time was exclusive of separately billable procedures and treating other patients. Critical care was necessary to treat or prevent imminent or life-threatening deterioration. Critical care was time spent personally by me on the following activities: development of treatment plan with patient and/or surrogate as well as nursing, discussions with consultants, evaluation of patient's response to treatment, examination of patient, obtaining history from patient or surrogate, ordering and performing treatments and interventions, ordering and review of laboratory studies, ordering and review of radiographic studies, pulse oximetry and re-evaluation of patient's condition.      Final diagnoses:  None    ED Discharge Orders     None          River Rankin, MD 11/17/24 [phone]

## 2024-11-17 NOTE — ED Triage Notes (Signed)
 Pt bib ems from home; originally called out for husband who fell; last seen by daughter 2 days ago; bruising over L eye; endorses illness x several day; n/v/d; today initial bp 72/30 HR 30 or 60 sustained; hx ckd; has not left house in 10 years per daughter;29 ga lac; 1 L ns given pta; last bp 100/50; cbg 131; 100% RA   Denies Shannon Blevins (daughter) [phone]

## 2024-11-17 NOTE — Consult Note (Signed)
 Cardiology Consultation   Patient ID: ARAYA ROEL MRN: [id number]; DOB: 11-26-1946  Admit date: 11/17/2024 Date of Consult: 11/17/2024  PCP:  Valma Carwin, MD   [facility] Providers Cardiologist:  None        Patient Profile: Shannon Blevins is a 78 y.o. female with a hx of hypertension, hypothyroidism, hyperlipidemia with no prior cardiac history who is being seen 11/17/2024 for the evaluation of bradycardia at the request of Patsey, MD.  History of Present Illness: Shannon Blevins 78 year old female came in in the emergency room after suffering a fall, has a left black eye.  Her husband called their daughter Karna Floras [phone] after he suffered a fall as well.  She noted that her mother had had increasing nausea vomiting diarrhea for several days.  Her daughter saw her last 2 days ago.  Her daughter hypes take care of her medications but she states that she has been having more and more confusion over the last several weeks.  She has required more help with daily activities.  2 weeks ago she saw her primary care physician who drew labs which included a creatinine of 3.05 and BUN of 14.  She was referred to nephrology.  Her daughter states that she has been taking quite a bit of BC powders recently as well.  Atenolol 50 mg 1 day  Gemtesa bladder medication 75 mg QD  Estrdiol 2 mg QD Levothyroxine  125 mcg Paroxitine 20 mg Telmisartan/ hydrochlorothiazide 80/25mg   Atorvastatin 10 mg  Not known when took meds last with her confusion.  HTN No DM No CAD  No prior syncopal episodes according to her daughter.  No chest pain.  Past Medical History:  Diagnosis Date   Allergy    Anxiety    Hypertension    Renal disorder    Thyroid disease     Past Surgical History:  Procedure Laterality Date   ABDOMINAL HYSTERECTOMY     CATARACT EXTRACTION     LASIK         Scheduled Meds:  Continuous Infusions:  PRN Meds:   Allergies:    Allergies[1]  Social History:   Social History   Socioeconomic History   Marital status: Married    Spouse name: Not on file   Number of children: Not on file   Years of education: Not on file   Highest education level: Not on file  Occupational History   Not on file  Tobacco Use   Smoking status: Former    Current packs/day: 0.00    Average packs/day: 1 pack/day for 20.0 years (20.0 ttl pk-yrs)    Types: Cigarettes    Start date: 86    Quit date: 2002    Years since quitting: 23.9   Smokeless tobacco: Never  Substance and Sexual Activity   Alcohol use: Not on file   Drug use: Not on file   Sexual activity: Not on file  Other Topics Concern   Not on file  Social History Narrative   Not on file   Social Drivers of Health   Tobacco Use: Medium Risk (11/17/2024)   Patient History    Smoking Tobacco Use: Former    Smokeless Tobacco Use: Never    Passive Exposure: Not on Actuary Strain: Not on file  Food Insecurity: Not on file  Transportation Needs: Not on file  Physical Activity: Not on file  Stress: Not on file  Social Connections: Not on file  Intimate Partner Violence: Not  on file  Depression (PHQ2-9): Not on file  Alcohol Screen: Not on file  Housing: Not on file  Utilities: Not on file  Health Literacy: Not on file    Family History:    Family History  Problem Relation Age of Onset   Cancer Mother    Hypertension Mother    Heart disease Mother    Alcohol abuse Father    Diabetes Paternal Grandmother    Cancer Paternal Grandmother      ROS:  Please see the history of present illness.   All other ROS reviewed and negative.     Physical Exam/Data: Vitals:   11/17/24 1415 11/17/24 1418  BP: (!) 96/41   Resp: 11 11   No intake or output data in the 24 hours ending 11/17/24 1445    10/30/2020    2:36 PM 08/27/2020   10:31 AM 05/07/2013    1:16 PM  Last 3 Weights  Weight (lbs) 148 lb 148 lb 147 lb 9.6 oz  Weight (kg) 67.132  kg 67.132 kg 66.951 kg     There is no height or weight on file to calculate BMI.  General:  Well nourished, well developed, in no acute distress HEENT: Left, periorbital ecchymosis laterally Neck: no JVD Vascular: No carotid bruits; Distal pulses 2+ bilaterally Cardiac:  normal S1, S2; bradycardic fairly regular; no murmur  Lungs:  clear to auscultation bilaterally, no wheezing, rhonchi or rales  Abd: soft, nontender, no hepatomegaly  Ext: no edema Musculoskeletal: Black eye left, BUE and BLE strength normal and equal Skin: warm and dry  Neuro:  CNs 2-12 intact, no focal abnormalities noted Psych: Somewhat confused.  Cannot tell me about fall.  EKG:  The EKG was personally reviewed and demonstrates: EKG strips from EMS appear to be 2-1 AV nodal conduction with heart rates in the 30s Telemetry:  Telemetry was personally reviewed and demonstrates: Similar to as above.  Heart rates in the 30s narrow complex QRS  Relevant CV Studies: None  Laboratory Data: High Sensitivity Troponin:  No results for input(s): TROPONINIHS in the last 720 hours. No results for input(s): TRNPT in the last 720 hours.    Chemistry Recent Labs  Lab 11/17/24 1433  NA 136  K 3.9  CL 111  GLUCOSE 109*  BUN >130*  CREATININE 11.50*    No results for input(s): PROT, ALBUMIN, AST, ALT, ALKPHOS, BILITOT in the last 168 hours. Lipids No results for input(s): CHOL, TRIG, HDL, LABVLDL, LDLCALC, CHOLHDL in the last 168 hours.  Hematology Recent Labs  Lab 11/17/24 1427 11/17/24 1433  WBC 8.0  --   RBC 3.22*  --   HGB 10.0* 9.2*  HCT 30.9* 27.0*  MCV 96.0  --   MCH 31.1  --   MCHC 32.4  --   RDW 13.2  --   PLT 255  --    Thyroid No results for input(s): TSH, FREET4 in the last 168 hours.  BNPNo results for input(s): BNP, PROBNP in the last 168 hours.  DDimer No results for input(s): DDIMER in the last 168 hours.  Radiology/Studies:  No results  found.   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  I have discussed this with her daughter as well as the patient.  Discussed potential risks and benefits of procedure.  Theoretically if we can improve her heart rate, we can  improve her cardiac output which therefore may increase her renal perfusion and potentially help reverse some of her acute kidney injury.  We will continue with aggressive fluid resuscitation.  We will check echocardiogram.  Will also check TSH given that she was on levothyroxine  as above. - If after resuscitative efforts she is still bradycardic and in need of temporary pacer wire after several hours/days, she may require permanent pacer. - Critical care medicine will admit to 2 heart with advanced heart failure team following. - It is also possible that her increased confusion nausea and diarrhea may be from uremia secondary to acute kidney injury/acute renal failure.  She was also taking lots of BC powders according to her daughter exacerbating her already stage IV chronic kidney disease with creatinine of 3, 2 weeks ago.  I have updated her daughter of her current situation.     Risk Assessment/Risk Scores:          For questions or updates, please contact [facility] Please consult [email or web address] for contact info under     CRITICAL CARE TIME Performed by: Oneil Parchment, MD   Total critical care time: 45  minutes  Critical care time was exclusive of separately billable procedures and treating other patients.  Critical care was necessary to treat or prevent imminent or life-threatening deterioration.  Critical care was time spent personally by me on the following activities: development of treatment plan with patient and/or surrogate as well as nursing, discussions with consultants, evaluation of patient's  response to treatment, examination of patient, obtaining history from patient or surrogate, ordering and performing treatments and interventions, ordering and review of laboratory studies, ordering and review of radiographic studies, pulse oximetry and re-evaluation of patient's condition. Signed, Oneil Parchment, MD  11/17/2024 2:45 PM     [1]  Allergies Allergen Reactions   Sulfa Antibiotics

## 2024-11-17 NOTE — Progress Notes (Signed)
 eLink Physician-Brief Progress Note Patient Name: Shannon Blevins DOB: 1946-07-29 MRN: [id number]   Date of Service  11/17/2024  HPI/Events of Note  Patient with agitated delirium, she almost pulled out her TVP per bedside RN.  eICU Interventions  Zyprexa  2.5 mg IM PRN, Posey restraints + mittens.        Shannon Blevins 11/17/2024, 11:05 PM

## 2024-11-17 NOTE — Consult Note (Addendum)
 Advanced Heart Failure Team Consult Note   Primary Physician: Valma Carwin, MD Cardiologist:  None HPI:    Shannon Blevins is seen today for evaluation of bradycardia and shock at the request of Dr. Jeffrie with [facility].   Shannon Blevins is a 78 y.o. female with history of HTN, hyperlipidemia, iron deficiency anemia, hypothyroidism. No known cardiac history on chart review. History from patient limited, relied on discussion with medical team and review of chart.  Saw her PCP 2 weeks ago. Had labs drawn and noted to have Scr of 3.05 and BUN of 14 (had scr of 1.2 on labs in 07/24).  Referred to Nephrology. Daughter indicated she had been using quite a bit of BC powders.   Patient Presented to ED today after a fall at home. Over the last week she had episodes of nausea, vomiting and diarrhea. Her daughter noted she had been more confused over the last few weeks and requiring more assistance with ADLs. ECG strips from EMS c/w 2:1 heart block with rates in the 30s. In ED noted to have Scr of 11, BUN > 130, HCO3 11, K 3.9 on iSTAT. Mag 1.7. TSH okay. She was hypotensive and confused in the ED. Given atropine  with no improvement and IVF. She was taken to the cath lab for TVP and admitted to the ICU under CCM.  Patient lives at home with her husband and has help from her daughter. She does not leave the home much as she does not drive. Prior to 2 weeks ago she reports she could walk around the house independently.   Home Medications Prior to Admission medications  Medication Sig Start Date End Date Taking? Authorizing Provider  atenolol (TENORMIN) 50 MG tablet Take 50 mg by mouth daily.    [provider]  betamethasone  dipropionate 0.05 % cream APPLY  CREAM TOPICALLY TO AFFECTED AREA ONCE DAILY 07/16/21   Livingston Rigg, MD  butalbital-acetaminophen -caffeine (FIORICET WITH CODEINE) 50-325-40-30 MG per capsule Take 1 capsule by mouth every 4 (four) hours as needed for headache.     [provider]  calcium -vitamin D (OSCAL WITH D) 250-125 MG-UNIT per tablet Take 1 tablet by mouth daily.    [provider]  calcium -vitamin D (OSCAL WITH D) 500-200 MG-UNIT tablet Take 1 tablet by mouth.    [provider]  cetirizine (ZYRTEC) 10 MG tablet Take 10 mg by mouth daily.    [provider]  clobetasol cream (TEMOVATE) 0.05 % Apply topically. 06/28/21   [provider]  diltiazem (DILACOR XR) 240 MG 24 hr capsule Take 240 mg by mouth daily.     [provider]  docusate sodium  (COLACE) 100 MG capsule Take 100 mg by mouth 2 (two) times daily.    [provider]  Dupilumab  (DUPIXENT ) 300 MG/2ML SOPN Inject 300 mg into the skin every 14 (fourteen) days. Starting at day 15 for maintenance. 08/06/21   Livingston Rigg, MD  estradiol (ESTRACE) 2 MG tablet Take 2 mg by mouth daily.    [provider]  ferrous fumarate (HEMOCYTE - 106 MG FE) 325 (106 FE) MG TABS Take 1 tablet by mouth.    [provider]  ferrous sulfate  324 MG TBEC Take 324 mg by mouth.    [provider]  FLUoxetine  (PROZAC ) 40 MG capsule Take 40 mg by mouth daily.    [provider]  hydrochlorothiazide (HYDRODIURIL) 12.5 MG tablet Take 12.5 mg by mouth daily.  [provider]  hydrOXYzine  (ATARAX /VISTARIL ) 25 MG tablet Take 0.5-1 tablets (12.5-25 mg total) by mouth every 8 (eight) hours as needed for itching. 07/10/21   Christopher Savannah, PA-C  L-Methylfolate-Algae (DEPLIN 15 PO) Take by mouth.    [provider]  levothyroxine  (SYNTHROID , LEVOTHROID) 137 MCG tablet Take 137 mcg by mouth daily before breakfast.    [provider]  losartan-hydrochlorothiazide (HYZAAR) 100-25 MG tablet Take 1 tablet by mouth daily. 06/17/20   [provider]  Multiple Vitamin (MULTIVITAMIN) tablet Take 1 tablet by mouth daily.    [provider]  Omega-3 Fatty Acids (FISH OIL) 1000 MG CAPS Take by mouth.     [provider]  PARoxetine  (PAXIL ) 20 MG tablet Take 20 mg by mouth daily. 07/09/20   [provider]  tacrolimus  (PROTOPIC ) 0.1 % ointment Apply topically 2 (two) times daily. 07/22/21   Tafeen, Stuart, MD  Turmeric 500 MG CAPS Take by mouth.    [provider]    Past Medical History: Past Medical History:  Diagnosis Date   Allergy    Anxiety    Hypertension    Renal disorder    Thyroid disease     Past Surgical History: Past Surgical History:  Procedure Laterality Date   ABDOMINAL HYSTERECTOMY     CATARACT EXTRACTION     LASIK      Family History: Family History  Problem Relation Age of Onset   Cancer Mother    Hypertension Mother    Heart disease Mother    Alcohol abuse Father    Diabetes Paternal Grandmother    Cancer Paternal Grandmother     Social History: Social History   Socioeconomic History   Marital status: Married    Spouse name: Not on file   Number of children: Not on file   Years of education: Not on file   Highest education level: Not on file  Occupational History   Not on file  Tobacco Use   Smoking status: Former    Current packs/day: 0.00    Average packs/day: 1 pack/day for 20.0 years (20.0 ttl pk-yrs)    Types: Cigarettes    Start date: 79    Quit date: 2002    Years since quitting: 23.9   Smokeless tobacco: Never  Substance and Sexual Activity   Alcohol use: Not on file   Drug use: Not on file   Sexual activity: Not on file  Other Topics Concern   Not on file  Social History Narrative   Not on file   Social Drivers of Health   Tobacco Use: Medium Risk (11/17/2024)   Patient History    Smoking Tobacco Use: Former    Smokeless Tobacco Use: Never    Passive Exposure: Not on Actuary Strain: Not on file  Food Insecurity: Not on file  Transportation Needs: Not on file  Physical Activity: Not on file  Stress: Not on file  Social Connections: Not on file  Depression (EYV7-0): Not on  file  Alcohol Screen: Not on file  Housing: Not on file  Utilities: Not on file  Health Literacy: Not on file    Allergies:  Allergies[1]  Objective:   Vital Signs:   Pulse Rate:  [41-88] 70 (12/18 1531) Resp:  [10-17] 14 (12/18 1525) BP: (72-102)/(31-58) 72/51 (12/18 1533) SpO2:  [78 %-93 %] 90 % (12/18 1533)    Weight change: There were no vitals filed for this visit.  Intake/Output:  No intake  or output data in the 24 hours ending 11/17/24 1538  Physical Exam  General:  Elderly female in no distress. Lying in bed. ENT: + ecchymoses surrounding left eye Cor: No JVD. Regular rate & rhythm. No murmurs.  Lungs: breathing nonlabored Extremities: no edema, legs are warm  Telemetry   V paced 70s  Labs   Basic Metabolic Panel: Recent Labs  Lab 11/17/24 1427 11/17/24 1433  NA 134* 136  K 4.0 3.9  CL 105 111  CO2 10*  --   GLUCOSE 108* 109*  BUN 124* >130*  CREATININE 10.50* 11.50*  CALCIUM 8.5*  --   MG 1.7  --     Liver Function Tests: Recent Labs  Lab 11/17/24 1427  AST 18  ALT 16  ALKPHOS 51  BILITOT 0.2  PROT 5.9*  ALBUMIN 3.4*   No results for input(s): LIPASE, AMYLASE in the last 168 hours. No results for input(s): AMMONIA in the last 168 hours.  CBC: Recent Labs  Lab 11/17/24 1427 11/17/24 1433  WBC 8.0  --   NEUTROABS 5.6  --   HGB 10.0* 9.2*  HCT 30.9* 27.0*  MCV 96.0  --   PLT 255  --     Cardiac Enzymes: No results for input(s): CKTOTAL, CKMB, CKMBINDEX, TROPONINI in the last 168 hours.  BNP: BNP (last 3 results) No results for input(s): BNP in the last 8760 hours.  ProBNP (last 3 results) No results for input(s): PROBNP in the last 8760 hours.   CBG: No results for input(s): GLUCAP in the last 168 hours.  Coagulation Studies: No results for input(s): LABPROT, INR in the last 72 hours.   Imaging   CT HEAD WO CONTRAST ( ) Result Date: 11/17/2024 EXAM: CT HEAD AND CERVICAL SPINE  11/17/2024 02:45:07 PM TECHNIQUE: CT of the head and cervical spine was performed without the administration of intravenous contrast. Multiplanar reformatted images are provided for review. Automated exposure control, iterative reconstruction, and/or weight based adjustment of the mA/kV was utilized to reduce the radiation dose to as low as reasonably achievable. COMPARISON: None available. CLINICAL HISTORY: Head trauma, moderate-severe. FINDINGS: CT HEAD BRAIN AND VENTRICLES: Hypoattenuating foci in the cerebral white matter, most likely representing chronic small vessel disease. Prominence of the sulci and ventricles is compatible with brain atrophy. No acute intracranial hemorrhage. No mass effect or midline shift. No abnormal extra-axial fluid collection. No evidence of acute infarct. No hydrocephalus. ORBITS: No acute abnormality. SINUSES AND MASTOIDS: No acute abnormality. SOFT TISSUES AND SKULL: Small superficial soft tissue contusion within the left supraorbital region. No acute skull fracture. CT CERVICAL SPINE BONES AND ALIGNMENT: Stepwise 1st degree anterolisthesis of C4 on C5, C5 on C6, and C6 on C7. No acute fracture or traumatic malalignment. DEGENERATIVE CHANGES: Multilevel disc space narrowing and endplate spurring noted. SOFT TISSUES: No prevertebral soft tissue swelling. IMPRESSION: 1. No acute intracranial abnormality. 2. Chronic small vessel ischemic disease and brain atrophy. 3. Small superficial soft tissue contusion within the left supraorbital region. 4. No acute cervical spine fracture or traumatic malalignment. 5. Cervical spondylosis. Electronically signed by: Waddell Calk MD 11/17/2024 03:19 PM EST RP Workstation: [id number]   CT Cervical Spine Wo Contrast Result Date: 11/17/2024 EXAM: CT HEAD AND CERVICAL SPINE 11/17/2024 02:45:07 PM TECHNIQUE: CT of the head and cervical spine was performed without the administration of intravenous contrast. Multiplanar reformatted images are  provided for review. Automated exposure control, iterative reconstruction, and/or weight based adjustment of the mA/kV was utilized to reduce the radiation  dose to as low as reasonably achievable. COMPARISON: None available. CLINICAL HISTORY: Head trauma, moderate-severe. FINDINGS: CT HEAD BRAIN AND VENTRICLES: Hypoattenuating foci in the cerebral white matter, most likely representing chronic small vessel disease. Prominence of the sulci and ventricles is compatible with brain atrophy. No acute intracranial hemorrhage. No mass effect or midline shift. No abnormal extra-axial fluid collection. No evidence of acute infarct. No hydrocephalus. ORBITS: No acute abnormality. SINUSES AND MASTOIDS: No acute abnormality. SOFT TISSUES AND SKULL: Small superficial soft tissue contusion within the left supraorbital region. No acute skull fracture. CT CERVICAL SPINE BONES AND ALIGNMENT: Stepwise 1st degree anterolisthesis of C4 on C5, C5 on C6, and C6 on C7. No acute fracture or traumatic malalignment. DEGENERATIVE CHANGES: Multilevel disc space narrowing and endplate spurring noted. SOFT TISSUES: No prevertebral soft tissue swelling. IMPRESSION: 1. No acute intracranial abnormality. 2. Chronic small vessel ischemic disease and brain atrophy. 3. Small superficial soft tissue contusion within the left supraorbital region. 4. No acute cervical spine fracture or traumatic malalignment. 5. Cervical spondylosis. Electronically signed by: Waddell Calk MD 11/17/2024 03:19 PM EST RP Workstation: [id number]    Medications:   Current Medications:  Chlorhexidine  Gluconate Cloth  6 each Topical Daily   [START ON 11/18/2024] ferrous sulfate   324 mg Oral Q breakfast   [START ON 11/18/2024] FLUoxetine   40 mg Oral Daily   heparin   5,000 Units Subcutaneous Q8H   [START ON 11/18/2024] levothyroxine   137 mcg Oral QAC breakfast   [START ON 11/18/2024] PARoxetine   20 mg Oral Daily    Infusions:  sodium chloride  10 mL/hr (11/17/24  1527)   lactated ringers      norepinephrine  (LEVOPHED ) 4 mg in dextrose  5 % 250 mL (0.016 mg/mL) infusion 5 mcg/min (11/17/24 1530)   sodium bicarbonate 150 mEq in sterile water  1,150 mL infusion     Patient Profile   Shannon Blevins is a 78 y.o. female with history of HTN, HLD, hypothyroidism, iron deficiency anemia, asthma. Presented after a fall and subsequently admitted for symptomatic bradycardia and acute renal failure.  Assessment/Plan  Symptomatic bradycardia 2:1 AV block -In setting of acute renal failure, GI losses and medications -Discontinue AV nodal blockers. On diltiazem and atenolol at home. -No significant response to atropine .  -S/p TVP 12/18. Hopefully conduction will improve without need for PPM -Echo pending  Acute renal failure Acidosis (likely metabolic) -Baseline Scr uncertain. Scr 1.2 in 7/24, reportedly up to 3 at PCP visit 2 weeks ago -Scr 11 on admit with BUN > 130 and HCO3 of 11 -IVF resuscitation, CCM starting bicarb gtt  HTN -Hypotensive on presentation in setting of high grade AV block and volume depletion -Hold all antihypertensives including above meds, hydrochlorothiazide and hyzaar   CRITICAL CARE Performed by: COLLETTA SHAVER N   Total critical care time: 15 minutes  Critical care time was exclusive of separately billable procedures and treating other patients.  Critical care was necessary to treat or prevent imminent or life-threatening deterioration.  Critical care was time spent personally by me on the following activities: development of treatment plan with patient and/or surrogate as well as nursing, discussions with consultants, evaluation of patient's response to treatment, examination of patient, obtaining history from patient or surrogate, ordering and performing treatments and interventions, ordering and review of laboratory studies, ordering and review of radiographic studies, pulse oximetry and re-evaluation of patient's condition.    Length of Stay: 0  FINCH, LINDSAY N, PA-C  11/17/2024, 3:38 PM  Advanced Heart Failure Team Pager [phone] (  M-F; 7a - 5p)   Please visit [email or web address]: For overnight coverage please call cardiology fellow first. If fellow not available call Shock/ECMO MD on call.  For ECMO / Mechanical Support (Impella, IABP, LVAD) issues call Shock / ECMO MD on call.    Patient seen with PA, I formulated the plan and agree with the above note.   78 y.o. with history of HTN and hypothyroidism was admitted with bradycardia/2:1 heart block.  At baseline, patient does not drive and rarely leaves her house.  However, she is able to do ADLs around the house without problems. She was seen by PCP 2 wks, ago, creatinine was 3.05.  She was referred to nephrology.  She reports taking BC Powders regularly.  She has had several days of nausea/vomiting/confusion as well as diarrhea.  She fell at home but denies syncope.  In hospital, HR in 30s with 2:1 AVB.  She is on atenolol and diltiazem at home.  BUN 124 with creatinine 10.5, TSH normal. Atropine  did not speed up HR, she was taken to cath lab for TTVP.  Currently, v-pacing rate 70.   Echo is being done currently.  LV EF appears preserved, EF 55-60%; RV mildly dilated with normal systolic function, no significant valvular abnormalities.   General: NAD Neck: No JVD, no thyromegaly or thyroid nodule.  Lungs: Clear to auscultation bilaterally with normal respiratory effort. CV: Nondisplaced PMI.  Heart regular S1/S2, no S3/S4, no murmur.  No peripheral edema.  No carotid bruit.  Normal pedal pulses.  Abdomen: Soft, nontender, no hepatosplenomegaly, no distention.  Skin: Intact without lesions or rashes.  Neurologic: Oriented to person/place.  Psych: Normal affect. Extremities: No clubbing or cyanosis.  HEENT: Normal.   1. Bradycardia: 2:1 AVB in setting of AKI with atenolol and diltiazem use. No chest pain.  Echo shows preserved LV EF 55-60%, mild RV dilation with  normal RV function. TSH was normal.  - Stopping nodal blockade.  - TTVP in place. - Follow rhythm, may need PPM.  2. AKI: Creatinine 3 two weeks ago, now up to 10.5 with BUN 124.  BC Powder use may play a role.  Also with bradycardia, nausea, vomiting and diarrhea.  All these could have triggered a prerenal insult proceeding to ATN.  She additionally has a metabolic acidosis though K is normal.  - Fluid resuscitation by CCM.  - Stop telmisartan/hydrochlorothiazide.  3. Hypothyroidism: Continue Levoxyl , TSH is normal.   CRITICAL CARE Performed by: Ezra Shuck  Total critical care time: 70 minutes  Critical care time was exclusive of separately billable procedures and treating other patients.  Critical care was necessary to treat or prevent imminent or life-threatening deterioration.  Critical care was time spent personally by me on the following activities: development of treatment plan with patient and/or surrogate as well as nursing, discussions with consultants, evaluation of patient's response to treatment, examination of patient, obtaining history from patient or surrogate, ordering and performing treatments and interventions, ordering and review of laboratory studies, ordering and review of radiographic studies, pulse oximetry and re-evaluation of patient's condition.  Ezra Shuck 11/17/2024 5:38 PM     [1]  Allergies Allergen Reactions   Sulfa Antibiotics

## 2024-11-18 ENCOUNTER — Inpatient Hospital Stay (HOSPITAL_COMMUNITY)

## 2024-11-18 DIAGNOSIS — R112 Nausea with vomiting, unspecified: Secondary | ICD-10-CM

## 2024-11-18 DIAGNOSIS — I459 Conduction disorder, unspecified: Secondary | ICD-10-CM | POA: Diagnosis not present

## 2024-11-18 DIAGNOSIS — R197 Diarrhea, unspecified: Secondary | ICD-10-CM

## 2024-11-18 DIAGNOSIS — G9340 Encephalopathy, unspecified: Secondary | ICD-10-CM

## 2024-11-18 LAB — BASIC METABOLIC PANEL WITH GFR
Anion gap: 20 — ABNORMAL HIGH (ref 5–15)
Anion gap: 17 — ABNORMAL HIGH (ref 5–15)
BUN: 110 mg/dL — ABNORMAL HIGH (ref 8–23)
BUN: 104 mg/dL — ABNORMAL HIGH (ref 8–23)
CO2: 13 mmol/L — ABNORMAL LOW (ref 22–32)
CO2: 14 mmol/L — ABNORMAL LOW (ref 22–32)
Calcium: 8.3 mg/dL — ABNORMAL LOW (ref 8.9–10.3)
Calcium: 8.1 mg/dL — ABNORMAL LOW (ref 8.9–10.3)
Chloride: 101 mmol/L (ref 98–111)
Chloride: 103 mmol/L (ref 98–111)
Creatinine, Ser: 7.42 mg/dL — ABNORMAL HIGH (ref 0.44–1.00)
Creatinine, Ser: 6.78 mg/dL — ABNORMAL HIGH (ref 0.44–1.00)
GFR, Estimated: 5 mL/min — ABNORMAL LOW
GFR, Estimated: 6 mL/min — ABNORMAL LOW
Glucose, Bld: 121 mg/dL — ABNORMAL HIGH (ref 70–99)
Glucose, Bld: 117 mg/dL — ABNORMAL HIGH (ref 70–99)
Potassium: 3.8 mmol/L (ref 3.5–5.1)
Potassium: 3.6 mmol/L (ref 3.5–5.1)
Sodium: 134 mmol/L — ABNORMAL LOW (ref 135–145)
Sodium: 134 mmol/L — ABNORMAL LOW (ref 135–145)

## 2024-11-18 LAB — COMPREHENSIVE METABOLIC PANEL WITH GFR
ALT: 13 U/L (ref 0–44)
AST: 17 U/L (ref 15–41)
Albumin: 3 g/dL — ABNORMAL LOW (ref 3.5–5.0)
Alkaline Phosphatase: 44 U/L (ref 38–126)
Anion gap: 16 — ABNORMAL HIGH (ref 5–15)
BUN: 97 mg/dL — ABNORMAL HIGH (ref 8–23)
CO2: 13 mmol/L — ABNORMAL LOW (ref 22–32)
Calcium: 6.9 mg/dL — ABNORMAL LOW (ref 8.9–10.3)
Chloride: 110 mmol/L (ref 98–111)
Creatinine, Ser: 6.74 mg/dL — ABNORMAL HIGH (ref 0.44–1.00)
GFR, Estimated: 6 mL/min — ABNORMAL LOW
Glucose, Bld: 100 mg/dL — ABNORMAL HIGH (ref 70–99)
Potassium: 2.5 mmol/L — CL (ref 3.5–5.1)
Sodium: 138 mmol/L (ref 135–145)
Total Bilirubin: 0.2 mg/dL (ref 0.0–1.2)
Total Protein: 4.9 g/dL — ABNORMAL LOW (ref 6.5–8.1)

## 2024-11-18 LAB — CBC
HCT: 24.5 % — ABNORMAL LOW (ref 36.0–46.0)
Hemoglobin: 8.3 g/dL — ABNORMAL LOW (ref 12.0–15.0)
MCH: 30.6 pg (ref 26.0–34.0)
MCHC: 33.9 g/dL (ref 30.0–36.0)
MCV: 90.4 fL (ref 80.0–100.0)
Platelets: 246 K/uL (ref 150–400)
RBC: 2.71 MIL/uL — ABNORMAL LOW (ref 3.87–5.11)
RDW: 12.9 % (ref 11.5–15.5)
WBC: 8.8 K/uL (ref 4.0–10.5)
nRBC: 0 % (ref 0.0–0.2)

## 2024-11-18 LAB — PHOSPHORUS: Phosphorus: 4.8 mg/dL — ABNORMAL HIGH (ref 2.5–4.6)

## 2024-11-18 LAB — MAGNESIUM: Magnesium: 1.2 mg/dL — ABNORMAL LOW (ref 1.7–2.4)

## 2024-11-18 MED ORDER — THIAMINE MONONITRATE 100 MG PO TABS
100.0000 mg | ORAL_TABLET | Freq: Every day | ORAL | Status: DC
  Administered 2024-11-21 – 2024-11-26 (×6): 100 mg via ORAL
  Filled 2024-11-18 (×6): qty 1

## 2024-11-18 MED ORDER — BREXPIPRAZOLE 0.25 MG PO TABS
0.5000 mg | ORAL_TABLET | Freq: Every day | ORAL | Status: DC
  Administered 2024-11-18 – 2024-11-26 (×9): 0.5 mg via ORAL
  Filled 2024-11-18 (×10): qty 2

## 2024-11-18 MED ORDER — NOREPINEPHRINE 4 MG/250ML-% IV SOLN
0.0000 ug/min | INTRAVENOUS | Status: AC
  Administered 2024-11-18 (×2): 3 ug/min via INTRAVENOUS
  Filled 2024-11-18 (×2): qty 250

## 2024-11-18 MED ORDER — LACTATED RINGERS IV BOLUS
1000.0000 mL | Freq: Once | INTRAVENOUS | Status: AC
  Administered 2024-11-18: 1000 mL via INTRAVENOUS

## 2024-11-18 MED ORDER — ORAL CARE MOUTH RINSE: 15.0000 mL | OROMUCOSAL | Status: DC | PRN

## 2024-11-18 MED ORDER — POTASSIUM CHLORIDE 10 MEQ/50ML IV SOLN
10.0000 meq | INTRAVENOUS | Status: AC
  Administered 2024-11-18 (×4): 10 meq via INTRAVENOUS
  Filled 2024-11-18 (×4): qty 50

## 2024-11-18 MED ORDER — THIAMINE HCL 100 MG/ML IJ SOLN
100.0000 mg | Freq: Every day | INTRAMUSCULAR | Status: AC
  Administered 2024-11-18 – 2024-11-20 (×3): 100 mg via INTRAVENOUS
  Filled 2024-11-18 (×3): qty 2

## 2024-11-18 MED ORDER — MAGNESIUM SULFATE 4 GM/100ML IV SOLN
4.0000 g | Freq: Once | INTRAVENOUS | Status: AC
  Administered 2024-11-18: 4 g via INTRAVENOUS
  Filled 2024-11-18: qty 100

## 2024-11-18 MED ORDER — FERROUS SULFATE 325 (65 FE) MG PO TABS
325.0000 mg | ORAL_TABLET | Freq: Every day | ORAL | Status: DC
  Administered 2024-11-18 – 2024-11-26 (×9): 325 mg via ORAL
  Filled 2024-11-18 (×9): qty 1

## 2024-11-18 MED ORDER — ACETAMINOPHEN 325 MG PO TABS
650.0000 mg | ORAL_TABLET | ORAL | Status: DC | PRN
  Administered 2024-11-18 – 2024-11-21 (×7): 650 mg via ORAL
  Filled 2024-11-18 (×7): qty 2

## 2024-11-18 NOTE — Progress Notes (Signed)
 This RN was giving report to second CHARITY FUNDRAISER. When I walked out I heard the patient's chair alarm going off. This RN ran into the room and the patient was standing beside her chair. Before I could reach her, the patient fell. She hit her left hip, did not hit her head. At the time she is not complaining of any pain. TVP is still pacing appropriately. E-Link notified with nightshift nurse at bedside. This RN called the patient's daughter, Karna Floras, who said she would be by in the morning to check on her mom.     Almarie Pellant, RN

## 2024-11-18 NOTE — Progress Notes (Addendum)
 "    Advanced Heart Failure Rounding Note  Cardiologist: None   Chief Complaint: Bradycardia  Patient Profile   Shannon Blevins is a 78 y.o. female with history of HTN, hyperlipidemia, iron deficiency anemia, hypothyroidism. No known cardiac history per patient (but history limited). Presented after a fall and subsequently admitted for symptomatic bradycardia and acute renal failure.   Significant events:   12/18 - Bradycardia with 2:1 AV block, s/p TVP  Subjective:    Currently V paced, 70.   On NE at 3 mcg/min.  Sitting up in chair. No complaints. Appears confused.    Objective:   Weight Range: 69 kg Body mass index is 27.38 kg/m.   Vital Signs:   Temp:  [97.5 F (36.4 C)-98.1 F (36.7 C)] 97.5 F (36.4 C) (12/19 0746) Pulse Rate:  [41-88] 69 (12/19 0845) Resp:  [10-24] 15 (12/19 1030) BP: (72-153)/(31-137) 83/49 (12/19 1030) SpO2:  [78 %-100 %] 100 % (12/19 0845) Weight:  [69 kg] 69 kg (12/19 0500) Last BM Date : 11/18/24  Weight change: Filed Weights   11/18/24 0500  Weight: 69 kg    Intake/Output:   Intake/Output Summary (Last 24 hours) at 11/18/2024 1112 Last data filed at 11/18/2024 1000 Gross per 24 hour  Intake 2541.1 ml  Output 550 ml  Net 1991.1 ml     Physical Exam   General:  elderly appearing HENT: ecchymoses surrounding left eye Cor: Regular rate & rhythm (paced). No murmurs. JVP not elevated Lungs: clear Extremities: no edema   Telemetry   V paced 70  Labs   CBC Recent Labs    11/17/24 1427 11/17/24 1433 11/17/24 2032 11/18/24 0843  WBC 8.0  --  10.6* 8.8  NEUTROABS 5.6  --   --   --   HGB 10.0*   < > 10.1* 8.3*  HCT 30.9*   < > 30.7* 24.5*  MCV 96.0  --  91.4 90.4  PLT 255  --  344 246   < > = values in this interval not displayed.   Basic Metabolic Panel Recent Labs    87/81/74 1427 11/17/24 1433 11/17/24 2032 11/18/24 0843  NA 134* 136  --  138  K 4.0 3.9  --  2.5*  CL 105 111  --  110  CO2 10*  --   --   13*  GLUCOSE 108* 109*  --  100*  BUN 124* >130*  --  97*  CREATININE 10.50* 11.50* 9.50* 6.74*  CALCIUM 8.5*  --   --  6.9*  MG 1.7  --  1.6* 1.2*  PHOS  --   --  7.7* 4.8*   Liver Function Tests Recent Labs    11/17/24 1427 11/18/24 0843  AST 18 17  ALT 16 13  ALKPHOS 51 44  BILITOT 0.2 0.2  PROT 5.9* 4.9*  ALBUMIN 3.4* 3.0*   No results for input(s): LIPASE, AMYLASE in the last 72 hours. Cardiac Enzymes No results for input(s): CKTOTAL, CKMB, CKMBINDEX, TROPONINI in the last 72 hours.  BNP: BNP (last 3 results) No results for input(s): BNP in the last 8760 hours.  ProBNP (last 3 results) Recent Labs    11/17/24 2032  PROBNP 2,351.0*     D-Dimer No results for input(s): DDIMER in the last 72 hours. Hemoglobin A1C No results for input(s): HGBA1C in the last 72 hours. Fasting Lipid Panel No results for input(s): CHOL, HDL, LDLCALC, TRIG, CHOLHDL, LDLDIRECT in the last 72 hours. Medications:  Scheduled Medications:  brexpiprazole  0.5 mg Oral Daily   Chlorhexidine  Gluconate Cloth  6 each Topical Daily   ferrous sulfate   325 mg Oral Q breakfast   heparin   5,000 Units Subcutaneous Q8H   levothyroxine   137 mcg Oral QAC breakfast   PARoxetine   20 mg Oral Daily   thiamine (VITAMIN B1) injection  100 mg Intravenous Daily   [START ON 11/21/2024] thiamine  100 mg Oral Daily    Infusions:  magnesium sulfate bolus IVPB 4 g (11/18/24 1051)   norepinephrine  (LEVOPHED ) Adult infusion 3 mcg/min (11/18/24 1000)   potassium chloride     sodium bicarbonate 150 mEq in sterile water  1,150 mL infusion 75 mL/hr at 11/18/24 1000    PRN Medications: docusate sodium , ondansetron  (ZOFRAN ) IV, polyethylene glycol  Assessment/Plan  1. Bradycardia: 2:1 AVB in setting of AKI with atenolol and diltiazem use. No chest pain.  Echo shows preserved LV EF 55-60%, mild RV dilation with normal RV function. TSH was normal.  - Stopping nodal blockade.  -  TTVP in place. Back up rate set to 70. Query if underlying rhythm today is afib with slow ventricular response, 30s-40s. She denies history of atrial fibrillation but seems confused. - Follow rhythm, may need PPM.  2. AKI: Scr in 7/24 was 1.2. Creatinine 3 two weeks ago, now up to 10.5 with BUN 124.  BC Powder use may play a role.  Also with bradycardia, nausea, vomiting and diarrhea.  All these could have triggered a prerenal insult proceeding to ATN.  She additionally had a metabolic acidosis though K is normal.  - Scr improving. K 6.7 this am - Fluid resuscitation by CCM. Continues on bicarb gtt for acidosis - Stop telmisartan/hydrochlorothiazide.  3. Hypothyroidism: Continue Levoxyl , TSH is normal.  4. Hypokalemia/hypomagnesemia: -K 2.5 and Mag 1.2 -Supp aggressively -Denies regular ETOH use 5. Atrial fibrillation: Noted to have some slow atrial fibrillation on telemetry that seems to have been transient.  Consider eventual anticoagulation.   Length of Stay: 1  FINCH, LINDSAY N, PA-C  11/18/2024, 11:12 AM  Advanced Heart Failure Team Pager [phone] (M-F; 7a - 5p)   Please visit [email or web address]: For overnight coverage please call cardiology fellow first. If fellow not available call Shock/ECMO MD on call.  For ECMO / Mechanical Support (Impella, IABP, LVAD) issues call Shock / ECMO MD on call.    Patient seen with PA, I formulated the plan and agree with the above note.   She is still pacer dependent, has underlying 2:1 AVB with rate 30s when I checked.  Earlier, did appear to have some transient atrial fibrillation.    She feels better, oriented this morning.  Still getting HCO3 gtt 75 cc/hr with creatinine down to 6.74. She is on NE 3 due to soft BP (SBP 90s-110s).    Echo showed EF 50-55% with septal hypokinesis in setting of pacing, mild RVE with normal RV size.   General: NAD Neck: No JVD, no thyromegaly or thyroid nodule.  Lungs: Clear to auscultation bilaterally with normal  respiratory effort. CV: Nondisplaced PMI.  Heart regular S1/S2, no S3/S4, no murmur.  No peripheral edema.   Abdomen: Soft, nontender, no hepatosplenomegaly, no distention.  Skin: Intact without lesions or rashes.  Neurologic: Alert and oriented x 3.  Psych: Normal affect. Extremities: No clubbing or cyanosis.  HEENT: Normal.   Continue TTVP, will increase rate to 80 bpm to see if we can help her BP.  She is on NE 3.  Echo showed  EF 50-55% with normal RV function.  Suspect her baseline BP is on the low side.  Will not be aggressive weaning the NE at this time as we allow her creatinine to settle.   Suspect prerenal azotemia as initial insult with creatinine up to 10 in setting of N/V/diarrhea.  Creatinine now trending down, 6.74 today.  550 cc UOP yesterday.  Volume status looks ok.  HCO3 remains low and she is still on HCO3 gtt 75 cc/hr.    Underlying rhythm still 2:1 AVB in 30s.  Washing out atenolol and diltiazem still.  Watching for PPM need.   CRITICAL CARE Performed by: Ezra Shuck  Total critical care time: 35 minutes  Critical care time was exclusive of separately billable procedures and treating other patients.  Critical care was necessary to treat or prevent imminent or life-threatening deterioration.  Critical care was time spent personally by me on the following activities: development of treatment plan with patient and/or surrogate as well as nursing, discussions with consultants, evaluation of patient's response to treatment, examination of patient, obtaining history from patient or surrogate, ordering and performing treatments and interventions, ordering and review of laboratory studies, ordering and review of radiographic studies, pulse oximetry and re-evaluation of patient's condition.  Ezra Shuck 11/18/2024 12:59 PM  "

## 2024-11-18 NOTE — Progress Notes (Addendum)
 "  NAME:  Shannon Blevins, MRN:  [id number], DOB:  Apr 24, 1946, LOS: 1 ADMISSION DATE:  11/17/2024, CONSULTATION DATE:  12/18 REFERRING MD:  Patsey, EDP CHIEF COMPLAINT:  bradycardia   History of Present Illness:  78 year old female with past medical history of asthma, hypertension, hypothyroidism, depression, iron deficiency anemia, hyperlipidemia who presents to the ER after fall. Daughter called EMS after patient fall at home, striking her face. Noted several days of nausea, vomiting, diarrhea. Daughter also notes more confusion over the past few weeks. In ED found to have heart blood with HR 30s, hypotensive. Was given 1mg  atropine  with no improvement. Given IVF. Cardiology consulted and taking to cath lab for TVP. CCM asked to admit.   Of note I-stat chem 8 w/ BUN >130, sCr 11.5. 2 weeks ago her labs showed BUN 14, sCr 3.05.   Pertinent  Medical History  Hypertension, HLD, hypothyroid, depression, IDA, asthma   Significant Hospital Events: Including procedures, antibiotic start and stop dates in addition to other pertinent events   12/18: admit for CHB, had TVP placed  Interim History / Subjective:  Confused. Thinks she has been in the hospital for 1 week. On norepi at 3 mcg/min Received 1L isotonic fluid y/d 550 UOP recorded  Objective   Blood pressure 94/63, pulse 69, temperature (!) 97.5 F (36.4 C), temperature source Axillary, resp. rate 14, weight 69 kg, SpO2 100%.        Intake/Output Summary (Last 24 hours) at 11/18/2024 0844 Last data filed at 11/18/2024 9271 Gross per 24 hour  Intake 2095.52 ml  Output 550 ml  Net 1545.52 ml   Filed Weights   11/18/24 0500  Weight: 69 kg    Examination: General: older female sitting up in bed,alert, conversant, but confused / A&Ox1 HENT: left eye ecchymosis Lungs: resp even and unlabored on 3 L [place] Cardiovascular: bradycardia, irregular  Abdomen: soft Extremities: warm, no edema  Neuro: awake, alert, oriented, moves all  extremities  GU: no foley   Resolved Hospital Problem list    Assessment & Plan:  Symptomatic bradycardia  AV nodal agent toxicity - TVP in place set to 70 bpm, cardiology following.  - unclear if this is renal failure mediated, K is normal despite acute renal failure  - echo show preserved LVEF, Grade I diastolic dysfunction, mildly enlarged RV but normal systolic function. RAP estimated at 3 mmHg. - hold all antihypertensives and rate control medications - on diltiazem, atenolol, hydrochlorothiazide, telmisartan-HCTZ  - tele monitoring   Acute renal failure  Uremic encephalopathy  Nausea, vomiting, diarrhea  2 weeks ago her labs showed BUN 14, sCr 3.05. suspect her days of n/v/d are related to worsening renal failure and uremia. Also suspect his caused her fall. Query pre-renal hypovolemia r/t GI losses on known CKD.  - remains on norepi, suspect she remains hypovolemic. On 3 L [place] but SpO2 100%, suspect she does not need this much but will also get CXR to help guide fluid strategy. In interim, 1 L LR ordered and will continue remaining order for low rate of NaHCO3. - has external urinary catheter. I would have a low threshold to place foley - trend bmp, mag, phos - replete elytes - strict I&O - Avoid nephrotoxic agents, renally dose medications - ensure adequate renal perfusion   Fall  Multifactorial likely r/t CHB as well as significant uremia  CT head and c-spine negative  - have eval by PT when appropriate   Iron deficiency anemia  - count stable  on admit  - restart when taking PO   HTN HLD - not on statin  - no antiHTN at current   Asthma  - no inhalers at home  Hypothyroid - TSH normal on admission. Continue home synthroid .  Labs   CBC: Recent Labs  Lab 11/17/24 1427 11/17/24 1433 11/17/24 2032  WBC 8.0  --  10.6*  NEUTROABS 5.6  --   --   HGB 10.0* 9.2* 10.1*  HCT 30.9* 27.0* 30.7*  MCV 96.0  --  91.4  PLT 255  --  344    Basic Metabolic  Panel: Recent Labs  Lab 11/17/24 1427 11/17/24 1433 11/17/24 2032  NA 134* 136  --   K 4.0 3.9  --   CL 105 111  --   CO2 10*  --   --   GLUCOSE 108* 109*  --   BUN 124* >130*  --   CREATININE 10.50* 11.50* 9.50*  CALCIUM 8.5*  --   --   MG 1.7  --  1.6*  PHOS  --   --  7.7*   GFR: CrCl cannot be calculated (Unknown ideal weight.). Recent Labs  Lab 11/17/24 1427 11/17/24 2032  WBC 8.0 10.6*    Liver Function Tests: Recent Labs  Lab 11/17/24 1427  AST 18  ALT 16  ALKPHOS 51  BILITOT 0.2  PROT 5.9*  ALBUMIN 3.4*   No results for input(s): LIPASE, AMYLASE in the last 168 hours. No results for input(s): AMMONIA in the last 168 hours.  ABG    Component Value Date/Time   TCO2 11 (L) 11/17/2024 1433     Coagulation Profile: No results for input(s): INR, PROTIME in the last 168 hours.  Cardiac Enzymes: No results for input(s): CKTOTAL, CKMB, CKMBINDEX, TROPONINI in the last 168 hours.  HbA1C: No results found for: HGBA1C  CBG: No results for input(s): GLUCAP in the last 168 hours.  Review of Systems:   As above   Past Medical History:  She,  has a past medical history of Allergy, Anxiety, Hypertension, Renal disorder, and Thyroid disease.   Surgical History:   Past Surgical History:  Procedure Laterality Date   ABDOMINAL HYSTERECTOMY     CATARACT EXTRACTION     LASIK     TEMPORARY PACEMAKER N/A 11/17/2024   Procedure: TEMPORARY PACEMAKER;  Surgeon: Wonda Sharper, MD;  Location: [facility] INVASIVE CV LAB;  Service: Cardiovascular;  Laterality: N/A;     Social History:   reports that she quit smoking about 23 years ago. Her smoking use included cigarettes. She started smoking about 43 years ago. She has a 20 pack-year smoking history. She has never used smokeless tobacco.   Family History:  Her family history includes Alcohol abuse in her father; Cancer in her mother and paternal grandmother; Diabetes in her paternal grandmother;  Heart disease in her mother; Hypertension in her mother.   Allergies Allergies[1]   Home Medications  Prior to Admission medications  Medication Sig Start Date End Date Taking? Authorizing Provider  atenolol (TENORMIN) 50 MG tablet Take 50 mg by mouth daily.    [provider]  betamethasone  dipropionate 0.05 % cream APPLY  CREAM TOPICALLY TO AFFECTED AREA ONCE DAILY 07/16/21   Tafeen, Stuart, MD  butalbital-acetaminophen-caffeine (FIORICET WITH CODEINE) 50-325-40-30 MG per capsule Take 1 capsule by mouth every 4 (four) hours as needed for headache.    [provider]  calcium-vitamin D (OSCAL WITH D) 250-125 MG-UNIT per tablet Take 1 tablet by mouth  daily.    [provider]  calcium-vitamin D (OSCAL WITH D) 500-200 MG-UNIT tablet Take 1 tablet by mouth.    [provider]  cetirizine (ZYRTEC) 10 MG tablet Take 10 mg by mouth daily.    [provider]  clobetasol cream (TEMOVATE) 0.05 % Apply topically. 06/28/21   [provider]  diltiazem (DILACOR XR) 240 MG 24 hr capsule Take 240 mg by mouth daily.     [provider]  docusate sodium  (COLACE) 100 MG capsule Take 100 mg by mouth 2 (two) times daily.    [provider]  Dupilumab  (DUPIXENT ) 300 MG/2ML SOPN Inject 300 mg into the skin every 14 (fourteen) days. Starting at day 15 for maintenance. 08/06/21   Livingston Rigg, MD  estradiol (ESTRACE) 2 MG tablet Take 2 mg by mouth daily.    [provider]  ferrous fumarate (HEMOCYTE - 106 MG FE) 325 (106 FE) MG TABS Take 1 tablet by mouth.    [provider]  ferrous sulfate  324 MG TBEC Take 324 mg by mouth.    [provider]  FLUoxetine  (PROZAC ) 40 MG capsule Take 40 mg by mouth daily.    [provider]  hydrochlorothiazide (HYDRODIURIL) 12.5 MG tablet Take 12.5 mg by mouth daily.     [provider]  hydrOXYzine  (ATARAX /VISTARIL ) 25 MG tablet Take 0.5-1 tablets (12.5-25 mg total)  by mouth every 8 (eight) hours as needed for itching. 07/10/21   Christopher Savannah, PA-C  L-Methylfolate-Algae (DEPLIN 15 PO) Take by mouth.    [provider]  levothyroxine  (SYNTHROID , LEVOTHROID) 137 MCG tablet Take 137 mcg by mouth daily before breakfast.    [provider]  losartan-hydrochlorothiazide (HYZAAR) 100-25 MG tablet Take 1 tablet by mouth daily. 06/17/20   [provider]  Multiple Vitamin (MULTIVITAMIN) tablet Take 1 tablet by mouth daily.    [provider]  Omega-3 Fatty Acids (FISH OIL) 1000 MG CAPS Take by mouth.    [provider]  PARoxetine  (PAXIL ) 20 MG tablet Take 20 mg by mouth daily. 07/09/20   [provider]  tacrolimus  (PROTOPIC ) 0.1 % ointment Apply topically 2 (two) times daily. 07/22/21   Tafeen, Stuart, MD  Turmeric 500 MG CAPS Take by mouth.    [provider]     Critical care time: 40 minutes   Lamar JINNY Dales, MD [facility] 11/18/2024 8:44 AM  Please see [email or web address] for pager details.  From 7A-7P if no response, please call [phone] After hours, please call ELink [phone]          [1]  Allergies Allergen Reactions   Sulfa Antibiotics Nausea And Vomiting   "

## 2024-11-19 ENCOUNTER — Inpatient Hospital Stay (HOSPITAL_COMMUNITY)

## 2024-11-19 DIAGNOSIS — R579 Shock, unspecified: Secondary | ICD-10-CM

## 2024-11-19 DIAGNOSIS — I459 Conduction disorder, unspecified: Secondary | ICD-10-CM | POA: Diagnosis not present

## 2024-11-19 LAB — URINALYSIS, ROUTINE W REFLEX MICROSCOPIC
Bilirubin Urine: NEGATIVE
Glucose, UA: NEGATIVE mg/dL
Ketones, ur: NEGATIVE mg/dL
Nitrite: POSITIVE — AB
Protein, ur: NEGATIVE mg/dL
Specific Gravity, Urine: 1.01 (ref 1.005–1.030)
pH: 5.5 (ref 5.0–8.0)

## 2024-11-19 LAB — RENAL FUNCTION PANEL
Albumin: 3.1 g/dL — ABNORMAL LOW (ref 3.5–5.0)
Anion gap: 17 — ABNORMAL HIGH (ref 5–15)
BUN: 102 mg/dL — ABNORMAL HIGH (ref 8–23)
CO2: 14 mmol/L — ABNORMAL LOW (ref 22–32)
Calcium: 8.2 mg/dL — ABNORMAL LOW (ref 8.9–10.3)
Chloride: 103 mmol/L (ref 98–111)
Creatinine, Ser: 6.91 mg/dL — ABNORMAL HIGH (ref 0.44–1.00)
GFR, Estimated: 6 mL/min — ABNORMAL LOW
Glucose, Bld: 94 mg/dL (ref 70–99)
Phosphorus: 4.5 mg/dL (ref 2.5–4.6)
Potassium: 3.6 mmol/L (ref 3.5–5.1)
Sodium: 134 mmol/L — ABNORMAL LOW (ref 135–145)

## 2024-11-19 LAB — URINALYSIS, MICROSCOPIC (REFLEX): WBC, UA: >50 WBC/hpf (ref 0–5)

## 2024-11-19 LAB — CBC
HCT: 25.9 % — ABNORMAL LOW (ref 36.0–46.0)
Hemoglobin: 9 g/dL — ABNORMAL LOW (ref 12.0–15.0)
MCH: 30.9 pg (ref 26.0–34.0)
MCHC: 34.7 g/dL (ref 30.0–36.0)
MCV: 89 fL (ref 80.0–100.0)
Platelets: 214 K/uL (ref 150–400)
RBC: 2.91 MIL/uL — ABNORMAL LOW (ref 3.87–5.11)
RDW: 12.7 % (ref 11.5–15.5)
WBC: 7.1 K/uL (ref 4.0–10.5)
nRBC: 0.4 % — ABNORMAL HIGH (ref 0.0–0.2)

## 2024-11-19 LAB — MAGNESIUM: Magnesium: 2.5 mg/dL — ABNORMAL HIGH (ref 1.7–2.4)

## 2024-11-19 LAB — T3: T3, Total: 50 ng/dL — ABNORMAL LOW (ref 71–180)

## 2024-11-19 MED ORDER — SODIUM CHLORIDE 0.9 % IV SOLN
1.0000 g | INTRAVENOUS | Status: AC
  Administered 2024-11-19 – 2024-11-23 (×5): 1 g via INTRAVENOUS
  Filled 2024-11-19 (×5): qty 10

## 2024-11-19 MED ORDER — LACTATED RINGERS IV SOLN: INTRAVENOUS | Status: AC

## 2024-11-19 MED ORDER — LACTATED RINGERS IV BOLUS
1000.0000 mL | Freq: Once | INTRAVENOUS | Status: AC
  Administered 2024-11-19: 1000 mL via INTRAVENOUS

## 2024-11-19 NOTE — Progress Notes (Signed)
" °   11/18/24 1933  What Happened  Was fall witnessed? Yes  Who witnessed fall? Almarie Pellant, RN  Patients activity before fall other (comment) (In chair)  Point of contact hip/leg (Left hip)  Was patient injured? Unsure  Provider Notification  Provider Name/Title FORBES Higashi RN  Date Provider Notified 11/18/24  Time Provider Notified 1933  Method of Notification Virtual Face-to-face  Notification Reason Fall  Follow Up  Family notified Yes - comment  Time family notified 1939  Adult Fall Risk Assessment  Patient Fall Risk Level High fall risk  Adult Fall Risk Interventions  Required Bundle Interventions *See Row Information* High fall risk  Screening for Fall Injury Risk (To be completed on HIGH fall risk patients) - Assessing Need for Floor Mats  Risk For Fall Injury- Criteria for Floor Mats Confusion/dementia (+NuDESC, CIWA, TBI, etc.)  Will Implement Floor Mats Yes    "

## 2024-11-19 NOTE — Progress Notes (Signed)
" ° °  NAME:  Shannon Blevins, MRN:  [id number], DOB:  September 27, 1946, LOS: 2 ADMISSION DATE:  11/17/2024, CONSULTATION DATE:  12/18 REFERRING MD:  Patsey, EDP CHIEF COMPLAINT:  bradycardia   History of Present Illness:  78 year old female with past medical history of asthma, hypertension, hypothyroidism, depression, iron deficiency anemia, hyperlipidemia who presents to the ER after fall. Daughter called EMS after patient fall at home, striking her face. Noted several days of nausea, vomiting, diarrhea. Daughter also notes more confusion over the past few weeks. In ED found to have heart blood with HR 30s, hypotensive. Was given 1mg  atropine  with no improvement. Given IVF. Cardiology consulted and taking to cath lab for TVP. CCM asked to admit.   Of note I-stat chem 8 w/ BUN >130, sCr 11.5. 2 weeks ago her labs showed BUN 14, sCr 3.05.   Pertinent  Medical History  Hypertension, HLD, hypothyroid, depression, IDA, asthma   Significant Hospital Events: Including procedures, antibiotic start and stop dates in addition to other pertinent events   12/18: admit for CHB, had TVP placed  Interim History / Subjective:  No events. Pleasantly confused. UOP a little sluggish.  Still pacer dependent, CHB.  Objective   Blood pressure (!) 104/59, pulse 72, temperature 98.9 F (37.2 C), temperature source Oral, resp. rate 16, weight 68.1 kg, SpO2 95%.        Intake/Output Summary (Last 24 hours) at 11/19/2024 1148 Last data filed at 11/19/2024 0000 Gross per 24 hour  Intake 1781.69 ml  Output 475 ml  Net 1306.69 ml   Filed Weights   11/18/24 0500 11/19/24 0500  Weight: 69 kg 68.1 kg    Examination: No distress Abd soft MM dry Ext warm RASS 0  Labs/imaging reviewed  Resolved Hospital Problem list    Assessment & Plan:   Looks like high grade AVB from dehydration and home meds.  TVP in place Acute renal failure- thought pre-renal but bladder scan this am showing retention Shock state  mostly hypovolemic improved Acute metabolic encephalopathy, uremic- nonfocal neuro exam Muscular deconditioning POA Hypothyroidism- TSH ok Fe def anemia  - Check UA, urine culture - Place foley - Additional hydration - Need to correct electrolyte abnormalities and see how kidneys settle out before deciding candidacy/need for PPM - Continue TVP, appreciate Dr. Chet help with settings - Re-orient, encourage day/night cycles - Up to chair; once TVP out will need mobility pushed  My cc time 33 mins Rolan Sharps MD PCCM "

## 2024-11-19 NOTE — Evaluation (Signed)
 Physical Therapy Evaluation Patient Details Name: Shannon Blevins MRN: [id number] DOB: December 01, 1946 Today's Date: 11/19/2024  History of Present Illness  Pt is a 78 yo female who presented to [facility] on 12/18 after EMS arrived at home to assess pt's husband and found pt had fallen at home, striking her face, was hypotensive, had increased confusion, and was noted to have several days of nausea, vomiting, and diarrhea. Pt admitted with symptomatic bradycardia, acute renal failure, and uremic encephalopathy. Pt s/p TVP placement on 12/18. PMH: HTN, HLD, hypothyroid, depression, IDA, asthma, iron deficiency anemia.   Clinical Impression  Pt in bed upon arrival of PT, agreeable to evaluation at this time. Prior to admission the pt was independent with mobility, reports use of SPC or no DME in the home despite multiple falls in last 6 months. The pt required minA to complete initial bed mobility and up to modA of 2 to complete pivotal steps to chair at this time. Pt reports dizziness with all changes in position despite BP remaining stable. Given limited mobility and need for assist of 1-2 for short distance mobility at this time, recommend post-acute therapies at inpatient rehab <3hours/day when medically stable for d/c.          If plan is discharge home, recommend the following: Two people to help with walking and/or transfers;Two people to help with bathing/dressing/bathroom;Assistance with cooking/housework;Help with stairs or ramp for entrance   Can travel by private vehicle   No    Equipment Recommendations Rolling walker (2 wheels)  Recommendations for Other Services       Functional Status Assessment Patient has had a recent decline in their functional status and demonstrates the ability to make significant improvements in function in a reasonable and predictable amount of time.     Precautions / Restrictions Precautions Precautions: Fall;Other (comment) Recall of Precautions/Restrictions:  Impaired Precaution/Restrictions Comments: TVP, fell during admission 12/19 Restrictions Weight Bearing Restrictions Per Provider Order: No      Mobility  Bed Mobility Overal bed mobility: Needs Assistance Bed Mobility: Supine to Sit     Supine to sit: Min assist     General bed mobility comments: cues for technique    Transfers Overall transfer level: Needs assistance Equipment used: 2 person hand held assist Transfers: Sit to/from Stand, Bed to chair/wheelchair/BSC Sit to Stand: Min assist, +2 safety/equipment Stand pivot transfers: Min assist, +2 safety/equipment         General transfer comment: cues for technique; pt leaning back against bed for additional support in standing, poor standing tolerance, fatigued after ~30 sec. modA for stand-pivot steps    Ambulation/Gait Ambulation/Gait assistance: Mod assist, +2 safety/equipment Gait Distance (Feet): 2 Feet Assistive device: 2 person hand held assist Gait Pattern/deviations: Step-to pattern Gait velocity: decreased Gait velocity interpretation: <1.31 ft/sec, indicative of household ambulator   General Gait Details: small lateral steps, modA to faciliate wt shift and manage posterior lean     Balance Overall balance assessment: Needs assistance Sitting-balance support: Single extremity supported, Bilateral upper extremity supported, Feet supported Sitting balance-Leahy Scale: Fair   Postural control: Posterior lean Standing balance support: Single extremity supported, Bilateral upper extremity supported, During functional activity (Reliant on assist of therapist for balance) Standing balance-Leahy Scale: Poor Standing balance comment: posterior lean, needing at least single UE support                             Pertinent Vitals/Pain Pain Assessment Pain  Assessment: No/denies pain    Home Living Family/patient expects to be discharged to:: Private residence Living Arrangements:  Spouse/significant other Available Help at Discharge: Family;Available PRN/intermittently (two daughters' one daughter lives nearby and stops in a few times a week; other daughter does not assist and rarely visits) Type of Home: House Home Access: Stairs to enter Entrance Stairs-Rails: Right;Left (at both doors) Secretary/administrator of Steps: 3 (at front; 8 at back)   Home Layout: One level Home Equipment: BSC/3in1;Tub bench;Rolling Walker (2 wheels);Cane - single point;Grab bars - toilet;Grab bars - tub/shower;Hand held shower head Additional Comments: Pt reports her husband has dementia    Prior Function Prior Level of Function : Independent/Modified Independent;Needs assist;History of Falls (last six months)             Mobility Comments: Ind to Mod I with intermittent use of SPC; reports multiple falls but reports most not serious ADLs Comments: Ind to Mod I with ADLs and light meal prep. Daughter assists with medication management, transportation, and PRN home management tasks.     Extremity/Trunk Assessment   Upper Extremity Assessment Upper Extremity Assessment: Defer to OT evaluation    Lower Extremity Assessment Lower Extremity Assessment: Generalized weakness (grossly 4-/5 to MMT, poor power and muscular endurance)    Cervical / Trunk Assessment Cervical / Trunk Assessment: Normal  Communication   Communication Communication: No apparent difficulties    Cognition Arousal: Alert Behavior During Therapy: WFL for tasks assessed/performed   PT - Cognitive impairments: No family/caregiver present to determine baseline, Memory, Attention, Problem solving, Safety/Judgement                       PT - Cognition Comments: pt needing increased cues and increased time Following commands: Impaired Following commands impaired: Only follows one step commands consistently, Follows multi-step commands inconsistently, Follows multi-step commands with increased  time     Cueing Cueing Techniques: Verbal cues, Gestural cues, Visual cues     General Comments General comments (skin integrity, edema, etc.): BP soft but stable, HR in the 70s-80s, and O2 >/93% on RA. RN present during session.    Exercises     Assessment/Plan    PT Assessment Patient needs continued PT services  PT Problem List Decreased strength;Decreased range of motion;Decreased activity tolerance;Decreased balance;Decreased mobility;Decreased safety awareness       PT Treatment Interventions DME instruction;Gait training;Stair training;Functional mobility training;Therapeutic activities;Therapeutic exercise;Balance training;Patient/family education    PT Goals (Current goals can be found in the Care Plan section)  Acute Rehab PT Goals Patient Stated Goal: to return home PT Goal Formulation: With patient Time For Goal Achievement: 12/03/24 Potential to Achieve Goals: Good    Frequency Min 2X/week     Co-evaluation   Reason for Co-Treatment: For patient/therapist safety;To address functional/ADL transfers PT goals addressed during session: Mobility/safety with mobility;Balance;Proper use of DME;Strengthening/ROM OT goals addressed during session: ADL's and self-care       AM-PAC PT 6 Clicks Mobility  Outcome Measure Help needed turning from your back to your side while in a flat bed without using bedrails?: A Little Help needed moving from lying on your back to sitting on the side of a flat bed without using bedrails?: A Little Help needed moving to and from a bed to a chair (including a wheelchair)?: A Lot Help needed standing up from a chair using your arms (e.g., wheelchair or bedside chair)?: A Lot Help needed to walk in hospital room?: Total Help needed climbing  3-5 steps with a railing? : Total 6 Click Score: 12    End of Session Equipment Utilized During Treatment: Gait belt Activity Tolerance: Patient tolerated treatment well;Patient limited by  fatigue (dizzy with standing) Patient left: in chair;with call bell/phone within reach;with chair alarm set;with nursing/sitter in room Nurse Communication: Mobility status PT Visit Diagnosis: Unsteadiness on feet (R26.81);Repeated falls (R29.6);Muscle weakness (generalized) (M62.81)    Time: 1140-1210 PT Time Calculation (min) (ACUTE ONLY): 30 min   Charges:   PT Evaluation $PT Eval Low Complexity: 1 Low   PT General Charges $$ ACUTE PT VISIT: 1 Visit         Izetta Call, PT, DPT   Acute Rehabilitation Department Office [phone] Secure Chat Communication Preferred  Izetta JULIANNA Call 11/19/2024, 3:45 PM

## 2024-11-19 NOTE — Progress Notes (Signed)
 "    Advanced Heart Failure Rounding Note  Cardiologist: None   Chief Complaint: Bradycardia  Patient Profile   Shannon Blevins is a 78 y.o. female with history of HTN, hyperlipidemia, iron deficiency anemia, hypothyroidism. No known cardiac history per patient (but history limited). Presented after a fall and subsequently admitted for symptomatic bradycardia and acute renal failure.   Significant events:   12/18 - Bradycardia with 2:1 AV block, s/p TVP  Subjective:    Remains v-paced at 80. Sinus 40s underneath. Threshold 2mA  NE down 3->1    Scr 6.8 -> 6.9    Objective:   Weight Range: 68.1 kg Body mass index is 27.02 kg/m.   Vital Signs:   Temp:  [97.9 F (36.6 C)-98.4 F (36.9 C)] 98.4 F (36.9 C) (12/20 0334) Pulse Rate:  [67-210] 78 (12/20 0700) Resp:  [12-23] 17 (12/20 0700) BP: (75-136)/(33-112) 84/54 (12/20 0700) SpO2:  [72 %-100 %] 97 % (12/20 0700) Weight:  [68.1 kg] 68.1 kg (12/20 0500) Last BM Date : 11/18/24  Weight change: Filed Weights   11/18/24 0500 11/19/24 0500  Weight: 69 kg 68.1 kg    Intake/Output:   Intake/Output Summary (Last 24 hours) at 11/19/2024 9077 Last data filed at 11/19/2024 0000 Gross per 24 hour  Intake 2611.4 ml  Output 475 ml  Net 2136.4 ml     Physical Exam   General:  Sitting up in bed. No resp difficulty HEENT: normal + Ecchymoses  Neck: supple. RIJ TVP  Cor: Regular rate & rhythm. No rubs, gallops or murmurs. Lungs: clear Abdomen: soft, nontender, nondistended.Good bowel sounds. Extremities: no cyanosis, clubbing, rash, edema Neuro: alert & orientedx3, cranial nerves grossly intact. moves all 4 extremities w/o difficulty. Affect pleasant   Telemetry   V paced 80 Personally reviewed  Labs   CBC Recent Labs    11/17/24 1427 11/17/24 1433 11/18/24 0843 11/19/24 0224  WBC 8.0   < > 8.8 7.1  NEUTROABS 5.6  --   --   --   HGB 10.0*   < > 8.3* 9.0*  HCT 30.9*   < > 24.5* 25.9*  MCV 96.0   < > 90.4  89.0  PLT 255   < > 246 214   < > = values in this interval not displayed.   Basic Metabolic Panel Recent Labs    87/80/74 0843 11/18/24 1545 11/18/24 2306 11/19/24 0224  NA 138   < > 134* 134*  K 2.5*   < > 3.6 3.6  CL 110   < > 103 103  CO2 13*   < > 14* 14*  GLUCOSE 100*   < > 117* 94  BUN 97*   < > 104* 102*  CREATININE 6.74*   < > 6.78* 6.91*  CALCIUM  6.9*   < > 8.1* 8.2*  MG 1.2*  --   --  2.5*  PHOS 4.8*  --   --  4.5   < > = values in this interval not displayed.   Liver Function Tests Recent Labs    11/17/24 1427 11/18/24 0843 11/19/24 0224  AST 18 17  --   ALT 16 13  --   ALKPHOS 51 44  --   BILITOT 0.2 0.2  --   PROT 5.9* 4.9*  --   ALBUMIN 3.4* 3.0* 3.1*   No results for input(s): LIPASE, AMYLASE in the last 72 hours. Cardiac Enzymes No results for input(s): CKTOTAL, CKMB, CKMBINDEX, TROPONINI in the last 72  hours.  BNP: BNP (last 3 results) No results for input(s): BNP in the last 8760 hours.  ProBNP (last 3 results) Recent Labs    11/17/24 2032  PROBNP 2,351.0*     D-Dimer No results for input(s): DDIMER in the last 72 hours. Hemoglobin A1C No results for input(s): HGBA1C in the last 72 hours. Fasting Lipid Panel No results for input(s): CHOL, HDL, LDLCALC, TRIG, CHOLHDL, LDLDIRECT in the last 72 hours. Medications:   Scheduled Medications:  brexpiprazole   0.5 mg Oral Daily   Chlorhexidine  Gluconate Cloth  6 each Topical Daily   ferrous sulfate   325 mg Oral Q breakfast   heparin   5,000 Units Subcutaneous Q8H   levothyroxine   137 mcg Oral QAC breakfast   PARoxetine   20 mg Oral Daily   thiamine  (VITAMIN B1) injection  100 mg Intravenous Daily   [START ON 11/21/2024] thiamine   100 mg Oral Daily    Infusions:  norepinephrine  (LEVOPHED ) Adult infusion 1 mcg/min (11/19/24 0000)    PRN Medications: acetaminophen , docusate sodium , ondansetron  (ZOFRAN ) IV, mouth rinse, polyethylene glycol  Assessment/Plan   1. Bradycardia: 2:1 AVB in setting of AKI with atenolol and diltiazem use. No chest pain.  Echo shows preserved LV EF 55-60%, mild RV dilation with normal RV function. TSH was normal.  - Stopping nodal blockade.  - Continues with underlying sinus bradycardia. Will continue v-pacing to help support BP renal recovery 2. AKI: Scr in 7/24 was 1.2. Creatinine 3 two weeks ago, now up to 10.5 with BUN 124.  BC Powder use may play a role.  Also with bradycardia, nausea, vomiting and diarrhea.  All these could have triggered a prerenal insult proceeding to ATN.  She additionally had a metabolic acidosis though K is normal.  - Scr relatively unchanged. K 6.7 -> 6.9 this am - Fluid resuscitation by CCM. Continues on bicarb gtt for acidosis - Off telmisartan/hydrochlorothiazide.  - No acute need for HD 3. Hypothyroidism: Continue Levoxyl , TSH is normal.  4. Hypokalemia/hypomagnesemia: -K 2.5 and Mag 1.2 -Supp aggressively -Denies regular ETOH use 5. Atrial fibrillation: Noted to have some slow atrial fibrillation on telemetry that seems to have been transient.   - if AF recurs may need AC  CRITICAL CARE Performed by: Rilynne Lonsway  Total critical care time: 36 minutes  Critical care time was exclusive of separately billable procedures and treating other patients.  Critical care was necessary to treat or prevent imminent or life-threatening deterioration.  Critical care was time spent personally by me (independent of midlevel providers or residents) on the following activities: development of treatment plan with patient and/or surrogate as well as nursing, discussions with consultants, evaluation of patient's response to treatment, examination of patient, obtaining history from patient or surrogate, ordering and performing treatments and interventions, ordering and review of laboratory studies, ordering and review of radiographic studies, pulse oximetry and re-evaluation of patient's  condition.   Length of Stay: 2  Toribio Fuel, MD  11/19/2024, 9:22 AM  Advanced Heart Failure Team Pager [phone] (M-F; 7a - 5p)   Please visit [email or web address]: For overnight coverage please call cardiology fellow first. If fellow not available call Shock/ECMO MD on call.  For ECMO / Mechanical Support (Impella, IABP, LVAD) issues call Shock / ECMO MD on call.     "

## 2024-11-19 NOTE — Evaluation (Signed)
 Occupational Therapy Evaluation Patient Details Name: Shannon Blevins MRN: [id number] DOB: [birth year] Today's Date: 11/19/2024   History of Present Illness   Pt is a 78 yo female who presented to [facility] on 12/18 after EMS arrived at home to assess pt's husband and found pt had fallen at home, striking her face, was hypotensive, had increased confusion, and was noted to have several days of nausea, vomiting, and diarrhea. Pt admitted with symptomatic bradycardia, acute renal failure, and uremic encephalopathy. Pt s/p TVP placement on 12/18. PMH: HTN, HLD, hypothyroid, depression, IDA, asthma, iron deficiency anemia     Clinical Impressions At baseline, pt is Independent to Mod I with ADLs, light meal prep, and functional mobility with intermittent use of a SPC. However, pt reports multiple falls in the past 6 months, including fall leading to this admission. At baseline, pt receives assistance from her daughter for medication management, transportation, and PRN assist with home management tasks. Pt now presents with decreased activity tolerance, generalized B UE weakness, decreased balance, decreased cognition, and decreased safety and independence with functional tasks. Pt currently demonstrates ability to complete ADLs largely with Set up to Max assist +2 with pt requiring increased assist to manage lines. Pt also demonstrates ability to complete bed mobility with Min assist and functional transfers with Min assist with HHA +2. Pt with soft but stable BP, HR in the 70s-80s, and O2 sat >/93% on RA. Pt participated well in session and is motivated to return to PLOF. Pt will benefit from acute skilled OT services to address deficits and increase safety and independence with functional tasks.Post acute discharge, recommending intensive inpatient skilled rehab services < 3 hours per day to maximize rehab potential, decrease risk of falls, and decrease risk of rehospitalization.      If plan is discharge  home, recommend the following:   Two people to help with walking and/or transfers;Two people to help with bathing/dressing/bathroom;Assistance with cooking/housework;Direct supervision/assist for medications management;Direct supervision/assist for financial management;Assist for transportation;Help with stairs or ramp for entrance;Supervision due to cognitive status     Functional Status Assessment   Patient has had a recent decline in their functional status and demonstrates the ability to make significant improvements in function in a reasonable and predictable amount of time.     Equipment Recommendations   None recommended by OT (pt already has needed equipment)     Recommendations for Other Services         Precautions/Restrictions   Precautions Precautions: Fall;Other (comment) Recall of Precautions/Restrictions: Impaired Precaution/Restrictions Comments: TVP Restrictions Weight Bearing Restrictions Per Provider Order: No     Mobility Bed Mobility Overal bed mobility: Needs Assistance Bed Mobility: Supine to Sit     Supine to sit: Min assist     General bed mobility comments: cues for technique    Transfers Overall transfer level: Needs assistance Equipment used: 2 person hand held assist Transfers: Sit to/from Stand, Bed to chair/wheelchair/BSC Sit to Stand: Min assist, +2 safety/equipment Stand pivot transfers: Min assist, +2 safety/equipment         General transfer comment: cues for technique; pt leaning back against bed for additional support in standing      Balance Overall balance assessment: Needs assistance Sitting-balance support: Single extremity supported, Bilateral upper extremity supported, Feet supported Sitting balance-Leahy Scale: Fair     Standing balance support: Single extremity supported, Bilateral upper extremity supported, During functional activity (Reliant on assist of therapist for balance) Standing balance-Leahy  Scale: Poor  ADL either performed or assessed with clinical judgement   ADL Overall ADL's : Needs assistance/impaired Eating/Feeding: Set up;Sitting   Grooming: Set up;Contact guard assist;Sitting   Upper Body Bathing: Minimal assistance;Moderate assistance;Sitting;Cueing for compensatory techniques   Lower Body Bathing: Maximal assistance;Sit to/from stand;+2 for safety/equipment;+2 for physical assistance   Upper Body Dressing : Minimal assistance;Moderate assistance;Sitting;Cueing for compensatory techniques   Lower Body Dressing: Maximal assistance;Sit to/from stand;+2 for safety/equipment;+2 for physical assistance   Toilet Transfer: Moderate assistance;Maximal assistance;+2 for safety/equipment;Stand-pivot;BSC/3in1 (+2 HHA) Toilet Transfer Details (indicate cue type and reason): simulated bed to recliner Toileting- Clothing Manipulation and Hygiene: +2 for safety/equipment;Sit to/from stand;+2 for physical assistance;Maximal assistance         General ADL Comments: Pt with decreased activity tolerance, fatiguing quickly during tasks. Pt requiring increased assistance due to extensive lines.     Vision Baseline Vision/History: 1 Wears glasses (readers) Ability to See in Adequate Light: 0 Adequate Patient Visual Report: No change from baseline Additional Comments: Vision [facility] for tasks assessed; not formally screened or evaluated     Perception         Praxis         Pertinent Vitals/Pain Pain Assessment Pain Assessment: No/denies pain     Extremity/Trunk Assessment Upper Extremity Assessment Upper Extremity Assessment: Left hand dominant;Generalized weakness   Lower Extremity Assessment Lower Extremity Assessment: Defer to PT evaluation       Communication Communication Communication: No apparent difficulties   Cognition Arousal: Alert Behavior During Therapy: WFL for tasks assessed/performed Cognition: Cognition  impaired, No family/caregiver present to determine baseline     Awareness: Intellectual awareness intact, Online awareness intact Memory impairment (select all impairments): Working civil service fast streamer, Short-term memory Attention impairment (select first level of impairment): Selective attention Executive functioning impairment (select all impairments): Problem solving, Organization OT - Cognition Comments: Pt AAOx4 and pleasant throughout session with cognitive deficits noted above. No family/caregiver present to confirm baseline.                 Following commands: Impaired Following commands impaired: Only follows one step commands consistently, Follows multi-step commands inconsistently, Follows multi-step commands with increased time     Cueing  General Comments   Cueing Techniques: Verbal cues;Gestural cues;Visual cues  BP soft but stable, HR in the 70s-80s, and O2 >/93% on RA. RN present during session.   Exercises     Shoulder Instructions      Home Living Family/patient expects to be discharged to:: Private residence Living Arrangements: Spouse/significant other Available Help at Discharge: Family;Available PRN/intermittently (two daughters' one daughter lives nearby and stops in a few times a week; other daughter does not assist and rarely visits) Type of Home: House Home Access: Stairs to enter [employer] of Steps: 3 (at front; 8 at back) Entrance Stairs-Rails: Right;Left (at both doors) Home Layout: One level     Bathroom Shower/Tub: Tub/shower unit;Walk-in shower (usually uses tub/shower)   Bathroom Toilet: Standard     Home Equipment: BSC/3in1;Tub bench;Rolling Environmental Consultant (2 wheels);Cane - single point;Grab bars - toilet;Grab bars - tub/shower;Hand held shower head   Additional Comments: Pt reports her husband has dementia      Prior Functioning/Environment Prior Level of Function : Independent/Modified Independent;Needs assist;History of Falls (last six  months)             Mobility Comments: Ind to Mod I with intermittent use of SPC; reports multiple falls but reports most not serious ADLs Comments: Ind to Mod I with ADLs and light  meal prep. Daughter assists with medication management, transportation, and PRN home management tasks.    OT Problem List: Decreased strength;Decreased activity tolerance;Impaired balance (sitting and/or standing);Decreased coordination;Decreased cognition;Decreased safety awareness;Decreased knowledge of use of DME or AE;Decreased knowledge of precautions;Cardiopulmonary status limiting activity   OT Treatment/Interventions: Self-care/ADL training;Therapeutic exercise;Energy conservation;DME and/or AE instruction;Therapeutic activities;Cognitive remediation/compensation;Patient/family education;Balance training      OT Goals(Current goals can be found in the care plan section)   Acute Rehab OT Goals Patient Stated Goal: to feel better and return home OT Goal Formulation: With patient Time For Goal Achievement: 12/03/24 Potential to Achieve Goals: Good ADL Goals Pt Will Perform Grooming: with supervision;standing Pt Will Perform Upper Body Bathing: with contact guard assist;sitting Pt Will Perform Lower Body Dressing: with min assist;sitting/lateral leans;sit to/from stand Pt Will Transfer to Toilet: with supervision;ambulating;regular height toilet (with least restricitve AD) Pt Will Perform Toileting - Clothing Manipulation and hygiene: with contact guard assist;sitting/lateral leans;sit to/from stand   OT Frequency:  Min 2X/week    Co-evaluation PT/OT/SLP Co-Evaluation/Treatment: Yes Reason for Co-Treatment: For patient/therapist safety;To address functional/ADL transfers   OT goals addressed during session: ADL's and self-care      AM-PAC OT 6 Clicks Daily Activity     Outcome Measure Help from another person eating meals?: A Little Help from another person taking care of personal  grooming?: A Little Help from another person toileting, which includes using toliet, bedpan, or urinal?: A Lot Help from another person bathing (including washing, rinsing, drying)?: A Lot Help from another person to put on and taking off regular upper body clothing?: A Little Help from another person to put on and taking off regular lower body clothing?: A Lot 6 Click Score: 15   End of Session Equipment Utilized During Treatment: Gait belt Nurse Communication: Mobility status;Precautions  Activity Tolerance: Patient tolerated treatment well Patient left: in chair;with call bell/phone within reach;with chair alarm set;with nursing/sitter in room  OT Visit Diagnosis: Unsteadiness on feet (R26.81);History of falling (Z91.81);Muscle weakness (generalized) (M62.81);Other symptoms and signs involving cognitive function                Time: 1141-1210 OT Time Calculation (min): 29 min Charges:  OT General Charges $OT Visit: 1 Visit OT Evaluation $OT Eval Moderate Complexity: 1 Mod  Margarie Rockey HERO., OTR/L, MA Acute Rehab [phone]   Margarie FORBES Horns 11/19/2024, 3:32 PM

## 2024-11-19 NOTE — Progress Notes (Signed)
" °   11/18/24 2000  Provider Notification  Provider Name/Title Ogan  Date Provider Notified 11/18/24  Time Provider Notified 1933  Method of Notification Virtual Face-to-face  Notification Reason  (fall)  Provider response No new orders;Evaluate remotely;Other (Comment)  Date of Provider Response 11/18/24  Time of Provider Response 1958  Adult Fall Risk Assessment  Risk Factor Category (scoring not indicated) Fall has occurred during this admission (document High fall risk)  Patient Fall Risk Level High fall risk  Adult Fall Risk Interventions  Required Bundle Interventions *See Row Information* High fall risk  Additional Interventions Room near nurses station;Use of appropriate toileting equipment (bedpan, BSC, etc.)  Screening for Fall Injury Risk (To be completed on HIGH fall risk patients) - Assessing Need for Floor Mats  Risk For Fall Injury- Criteria for Floor Mats Confusion/dementia (+NuDESC, CIWA, TBI, etc.)  Will Implement Floor Mats Yes  Vitals  BP 123/73  MAP (mmHg) 88  BP Location Left Arm  BP Method Automatic  Patient Position (if appropriate) Lying  Pulse Rate 79  Pulse Rate Source Monitor  ECG Heart Rate 82  Cardiac Rhythm Ventricular paced  Resp 16  Oxygen Therapy  SpO2 100 %  O2 Device Room Air  Patient Activity (if Appropriate) In bed  Pulse Oximetry Type Continuous  Pain Assessment  Pain Scale 0-10  Pain Score 5  Pain Location Knee  Pain Orientation Left  Pain Intervention(s) Medication (See eMAR)  PCA/Epidural/Spinal Assessment  Respiratory Pattern Regular;Unlabored  Neurological  Level of Consciousness Alert  Orientation Level Oriented to person;Oriented to place;Oriented to situation;Disoriented to time  Cognition Poor judgement;Poor safety awareness;Appropriate attention/concentration  Speech Clear  R Pupil Size (mm) 3  R Pupil Shape Round  R Pupil Reaction Brisk  L Pupil Size (mm) 3  L Pupil Shape Round  L Pupil Reaction Brisk  Motor  Function/Sensation Assessment Motor response  RUE Motor Response Purposeful movement;Responds to commands  LUE Motor Response Purposeful movement;Responds to commands  RLE Motor Response Purposeful movement;Responds to commands  LLE Motor Response Purposeful movement;Responds to commands  Glasgow Coma Scale  Eye Opening 4  Best Verbal Response (NON-intubated) 4  Best Motor Response 6  Glasgow Coma Scale Score 14  Musculoskeletal  Musculoskeletal (WDL) X  Assistive Device None  Generalized Weakness Yes  Weight Bearing Restrictions Per Provider Order No  Integumentary  Integumentary (WDL) X  Skin Color Appropriate for ethnicity  Skin Condition Dry  Skin Integrity Intact;Ecchymosis  Ecchymosis Location Face  Ecchymosis Location Orientation Left  Skin Turgor Non-tenting   "

## 2024-11-20 DIAGNOSIS — I459 Conduction disorder, unspecified: Secondary | ICD-10-CM | POA: Diagnosis not present

## 2024-11-20 LAB — RENAL FUNCTION PANEL
Albumin: 2.7 g/dL — ABNORMAL LOW (ref 3.5–5.0)
Anion gap: 11 (ref 5–15)
BUN: 89 mg/dL — ABNORMAL HIGH (ref 8–23)
CO2: 20 mmol/L — ABNORMAL LOW (ref 22–32)
Calcium: 8.2 mg/dL — ABNORMAL LOW (ref 8.9–10.3)
Chloride: 110 mmol/L (ref 98–111)
Creatinine, Ser: 5.4 mg/dL — ABNORMAL HIGH (ref 0.44–1.00)
GFR, Estimated: 8 mL/min — ABNORMAL LOW
Glucose, Bld: 90 mg/dL (ref 70–99)
Phosphorus: 4.1 mg/dL (ref 2.5–4.6)
Potassium: 3.3 mmol/L — ABNORMAL LOW (ref 3.5–5.1)
Sodium: 140 mmol/L (ref 135–145)

## 2024-11-20 LAB — CBC
HCT: 22.4 % — ABNORMAL LOW (ref 36.0–46.0)
Hemoglobin: 7.5 g/dL — ABNORMAL LOW (ref 12.0–15.0)
MCH: 31 pg (ref 26.0–34.0)
MCHC: 33.5 g/dL (ref 30.0–36.0)
MCV: 92.6 fL (ref 80.0–100.0)
Platelets: 187 K/uL (ref 150–400)
RBC: 2.42 MIL/uL — ABNORMAL LOW (ref 3.87–5.11)
RDW: 13.2 % (ref 11.5–15.5)
WBC: 5.7 K/uL (ref 4.0–10.5)
nRBC: 0 % (ref 0.0–0.2)

## 2024-11-20 LAB — MAGNESIUM: Magnesium: 2.2 mg/dL (ref 1.7–2.4)

## 2024-11-20 MED ORDER — POTASSIUM CHLORIDE 20 MEQ PO PACK
40.0000 meq | PACK | Freq: Once | ORAL | Status: AC
  Administered 2024-11-20: 40 meq via ORAL
  Filled 2024-11-20: qty 2

## 2024-11-20 NOTE — NC FL2 (Signed)
 " [place]  MEDICAID FL2 LEVEL OF CARE FORM     IDENTIFICATION  Patient Name: Shannon Blevins Birthdate: 1946/07/17 Sex: female Admission Date (Current Location): 11/17/2024  [facility] and [place] Number:  Producer, Television/film/video and Address:  [facility], [address] [place], [place] [address]      Provider Number: [id number]  Attending Physician Name and Address:  Claudene Toribio BROCKS, MD  Relative Name and Phone Number:  Karna (Daughter)  [phone]    Current Level of Care: Hospital Recommended Level of Care: Skilled Nursing Facility Prior Approval Number:    Date Approved/Denied:   PASRR Number: [id number]  Discharge Plan: SNF    Current Diagnoses: Patient Active Problem List   Diagnosis Date Noted   Heart block 11/17/2024   Symptomatic bradycardia 11/17/2024    Orientation RESPIRATION BLADDER Height & Weight     Self  Normal Incontinent Weight: 146 lb 2.6 oz (66.3 kg) Height:     BEHAVIORAL SYMPTOMS/MOOD NEUROLOGICAL BOWEL NUTRITION STATUS      Continent Diet (See DC Summary)  AMBULATORY STATUS COMMUNICATION OF NEEDS Skin   Extensive Assist Verbally Normal                       Personal Care Assistance Level of Assistance  Bathing, Dressing, Feeding Bathing Assistance: Maximum assistance Feeding assistance: Limited assistance Dressing Assistance: Maximum assistance     Functional Limitations Info  Sight, Speech, Hearing Sight Info: Adequate Hearing Info: Adequate Speech Info: Adequate    SPECIAL CARE FACTORS FREQUENCY  PT (By licensed PT), OT (By licensed OT)     PT Frequency: 5x/week OT Frequency: 5x/week            Contractures Contractures Info: Not present    Additional Factors Info  Code Status, Allergies Code Status Info: Full Allergies Info: Sulfa Antibiotics           Current Medications (11/20/2024):  This is the current hospital active medication list Current Facility-Administered Medications   Medication Dose Route Frequency Provider Last Rate Last Admin   acetaminophen  (TYLENOL ) tablet 650 mg  650 mg Oral Q4H PRN Ogan, Okoronkwo U, MD   650 mg at 11/20/24 9047   brexpiprazole  (REXULTI ) tablet 0.5 mg  0.5 mg Oral Daily Stretch, Robert J, MD   0.5 mg at 11/20/24 9047   cefTRIAXone  (ROCEPHIN ) 1 g in sodium chloride  0.9 % 100 mL IVPB  1 g Intravenous Q24H Claudene Toribio BROCKS, MD   Stopped at 11/19/24 2218   Chlorhexidine  Gluconate Cloth 2 % PADS 6 each  6 each Topical Daily Autry, Lauren E, PA-C   6 each at 11/20/24 9048   docusate sodium  (COLACE) capsule 100 mg  100 mg Oral BID PRN Autry, Lauren E, PA-C       ferrous sulfate  tablet 325 mg  325 mg Oral Q breakfast Bitonti, Michael T, RPH   325 mg at 11/20/24 9048   heparin  injection 5,000 Units  5,000 Units Subcutaneous Q8H Autry, Lauren E, PA-C   5,000 Units at 11/20/24 9459   lactated ringers  infusion   Intravenous Continuous Claudene Toribio BROCKS, MD 100 mL/hr at 11/20/24 0900 Infusion Verify at 11/20/24 0900   levothyroxine  (SYNTHROID ) tablet 137 mcg  137 mcg Oral QAC breakfast Adolph Tinnie BRAVO, PA-C   137 mcg at 11/20/24 9461   norepinephrine  (LEVOPHED ) 4mg  in (0.016 mg/mL) premix infusion  0-40 mcg/min Intravenous Titrated Ogan, Okoronkwo U, MD   Stopped at  11/19/24 0003   ondansetron  (ZOFRAN ) injection 4 mg  4 mg Intravenous Q6H PRN Autry, Lauren E, PA-C   4 mg at 11/17/24 2008   Oral care mouth rinse  15 mL Mouth Rinse PRN Stretch, Lamar PARAS, MD       PARoxetine  (PAXIL ) tablet 20 mg  20 mg Oral Daily Autry, Lauren E, PA-C   20 mg at 11/20/24 0952   polyethylene glycol (MIRALAX  / GLYCOLAX ) packet 17 g  17 g Oral Daily PRN Autry, Lauren E, PA-C       [START ON 11/21/2024] thiamine  (VITAMIN B1) tablet 100 mg  100 mg Oral Daily Stretch, Lamar PARAS, MD         Discharge Medications: Please see discharge summary for a list of discharge medications.  Relevant Imaging Results:  Relevant Lab Results:   Additional Information SSN:  [id number]  Jeoffrey LITTIE Moose, LCSWA     "

## 2024-11-20 NOTE — Progress Notes (Signed)
 "    Advanced Heart Failure Rounding Note  Cardiologist: None   Chief Complaint: Bradycardia  Patient Profile   Shannon Blevins is a 78 y.o. female with history of HTN, hyperlipidemia, iron deficiency anemia, hypothyroidism. No known cardiac history per patient (but history limited). Presented after a fall and subsequently admitted for symptomatic bradycardia and acute renal failure.   Significant events:   12/18 - Bradycardia with 2:1 AV block, s/p TVP  Subjective:    Remains v-paced at 70. Now off NE  Scr improving with hydration.   Underlying rhythm Sinus brady 30s  Threshold on TVP 3mA (checked personally)   Objective:   Weight Range: 66.3 kg Body mass index is 26.31 kg/m.   Vital Signs:   Temp:  [97.9 F (36.6 C)-98.4 F (36.9 C)] 97.9 F (36.6 C) (12/21 1950) Pulse Rate:  [68-71] 68 (12/21 2100) Resp:  [5-25] 13 (12/21 2100) BP: (82-124)/(42-72) 96/54 (12/21 2100) SpO2:  [94 %-100 %] 98 % (12/21 2100) Weight:  [66.3 kg] 66.3 kg (12/21 0500) Last BM Date : 11/18/24  Weight change: Filed Weights   11/18/24 0500 11/19/24 0500 11/20/24 0500  Weight: 69 kg 68.1 kg 66.3 kg    Intake/Output:   Intake/Output Summary (Last 24 hours) at 11/20/2024 2119 Last data filed at 11/20/2024 2100 Gross per 24 hour  Intake 2641.24 ml  Output 2380 ml  Net 261.24 ml     Physical Exam   General:  Sitting up in bed. No resp difficulty HEENT: normal left orbital ecchymosis Neck: supple. RIJ TVP  Cor: Regular rate & rhythm. No rubs, gallops or murmurs. Lungs: clear Abdomen: soft, nontender, nondistended.Good bowel sounds. Extremities: no cyanosis, clubbing, rash, edema Neuro: alert & orientedx3, cranial nerves grossly intact. moves all 4 extremities w/o difficulty. Affect pleasant    Telemetry   V-paced 70 Underlying rhythm Sinus brady 30s  Threshold on TVP 3mA (checked personally)  Labs   CBC Recent Labs    11/19/24 0224 11/20/24 0357  WBC 7.1 5.7  HGB  9.0* 7.5*  HCT 25.9* 22.4*  MCV 89.0 92.6  PLT 214 187   Basic Metabolic Panel Recent Labs    87/79/74 0224 11/20/24 0357  NA 134* 140  K 3.6 3.3*  CL 103 110  CO2 14* 20*  GLUCOSE 94 90  BUN 102* 89*  CREATININE 6.91* 5.40*  CALCIUM  8.2* 8.2*  MG 2.5* 2.2  PHOS 4.5 4.1   Liver Function Tests Recent Labs    11/18/24 0843 11/19/24 0224 11/20/24 0357  AST 17  --   --   ALT 13  --   --   ALKPHOS 44  --   --   BILITOT 0.2  --   --   PROT 4.9*  --   --   ALBUMIN 3.0* 3.1* 2.7*   No results for input(s): LIPASE, AMYLASE in the last 72 hours. Cardiac Enzymes No results for input(s): CKTOTAL, CKMB, CKMBINDEX, TROPONINI in the last 72 hours.  BNP: BNP (last 3 results) No results for input(s): BNP in the last 8760 hours.  ProBNP (last 3 results) Recent Labs    11/17/24 2032  PROBNP 2,351.0*     D-Dimer No results for input(s): DDIMER in the last 72 hours. Hemoglobin A1C No results for input(s): HGBA1C in the last 72 hours. Fasting Lipid Panel No results for input(s): CHOL, HDL, LDLCALC, TRIG, CHOLHDL, LDLDIRECT in the last 72 hours. Medications:   Scheduled Medications:  brexpiprazole   0.5 mg Oral Daily  Chlorhexidine  Gluconate Cloth  6 each Topical Daily   ferrous sulfate   325 mg Oral Q breakfast   heparin   5,000 Units Subcutaneous Q8H   levothyroxine   137 mcg Oral QAC breakfast   PARoxetine   20 mg Oral Daily   [START ON 11/21/2024] thiamine   100 mg Oral Daily    Infusions:  cefTRIAXone  (ROCEPHIN )  IV 1 g (11/20/24 2105)   lactated ringers  100 mL/hr at 11/20/24 2113   norepinephrine  (LEVOPHED ) Adult infusion Stopped (11/19/24 0003)    PRN Medications: acetaminophen , docusate sodium , ondansetron  (ZOFRAN ) IV, mouth rinse, polyethylene glycol  Assessment/Plan  1. Bradycardia: 2:1 AVB in setting of AKI with atenolol and diltiazem use. Also sinus node dysfunction. No chest pain.  Echo shows preserved LV EF 55-60%, mild RV  dilation with normal RV function. TSH was normal.  - Off nodal blockade.  - Continues with underlying sinus bradycardia in 30s. (SBP drops about 10 points without vpacing). Will continue v-pacing to help support BP &  renal recovery - Likely will reach out to EP in am if persists 2. AKI: Scr in 7/24 was 1.2. Creatinine 3 two weeks ago, now up to 10.5 with BUN 124.  BC Powder use may play a role.  Also with bradycardia, nausea, vomiting and diarrhea.  All these could have triggered a prerenal insult proceeding to ATN.  She additionally had a metabolic acidosis though K is normal.  -- Scr improving with IVF. Continu - Off telmisartan/hydrochlorothiazide.  - No acute need for HD 3. Hypothyroidism: Continue Levoxyl , TSH is normal.  4. Hypokalemia/hypomagnesemia: -K 2.5 and Mag 1.2 on presentation  -Has been supped 5. Atrial fibrillation: Noted to have some slow atrial fibrillation on telemetry that seems to have been transient.   - if AF recurs may need AC - no recurrence overnight  D/w CCM on bedside rounds   CRITICAL CARE Performed by: Norbert Malkin  Total critical care time: 37 minutes  Critical care time was exclusive of separately billable procedures and treating other patients.  Critical care was necessary to treat or prevent imminent or life-threatening deterioration.  Critical care was time spent personally by me (independent of midlevel providers or residents) on the following activities: development of treatment plan with patient and/or surrogate as well as nursing, discussions with consultants, evaluation of patient's response to treatment, examination of patient, obtaining history from patient or surrogate, ordering and performing treatments and interventions, ordering and review of laboratory studies, ordering and review of radiographic studies, pulse oximetry and re-evaluation of patient's condition.   Length of Stay: 3  Toribio Fuel, MD  11/20/2024, 9:19  PM  Advanced Heart Failure Team Pager [phone] (M-F; 7a - 5p)   Please visit [email or web address]: For overnight coverage please call cardiology fellow first. If fellow not available call Shock/ECMO MD on call.  For ECMO / Mechanical Support (Impella, IABP, LVAD) issues call Shock / ECMO MD on call.     "

## 2024-11-20 NOTE — Progress Notes (Signed)
" ° °  NAME:  Shannon Blevins, MRN:  [id number], DOB:  1945/12/27, LOS: 3 ADMISSION DATE:  11/17/2024, CONSULTATION DATE:  12/18 REFERRING MD:  Patsey, EDP CHIEF COMPLAINT:  bradycardia   History of Present Illness:  78 year old female with past medical history of asthma, hypertension, hypothyroidism, depression, iron deficiency anemia, hyperlipidemia who presents to the ER after fall. Daughter called EMS after patient fall at home, striking her face. Noted several days of nausea, vomiting, diarrhea. Daughter also notes more confusion over the past few weeks. In ED found to have heart blood with HR 30s, hypotensive. Was given 1mg  atropine  with no improvement. Given IVF. Cardiology consulted and taking to cath lab for TVP. CCM asked to admit.   Of note I-stat chem 8 w/ BUN >130, sCr 11.5. 2 weeks ago her labs showed BUN 14, sCr 3.05.   Pertinent  Medical History  Hypertension, HLD, hypothyroid, depression, IDA, asthma   Significant Hospital Events: Including procedures, antibiotic start and stop dates in addition to other pertinent events   12/18: admit for CHB, had TVP placed  Interim History / Subjective:  No events. Feels okay.  Slept okay.   Objective   Blood pressure (!) 104/58, pulse 69, temperature 98 F (36.7 C), temperature source Oral, resp. rate 14, weight 66.3 kg, SpO2 98%.        Intake/Output Summary (Last 24 hours) at 11/20/2024 1332 Last data filed at 11/20/2024 0957 Gross per 24 hour  Intake 2210.6 ml  Output 2000 ml  Net 210.6 ml   Filed Weights   11/18/24 0500 11/19/24 0500 11/20/24 0500  Weight: 69 kg 68.1 kg 66.3 kg    Examination: A little confused still MMM, trachea midline  Moves to command, RASS 0 Eye bruising stable Lungs/heart sounds fine, paced, underlying appears ?severe sinus brady  Labs/imaging reviewed  Resolved Hospital Problem list    Assessment & Plan:   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 unclear if this is primary issue that led to dehydration,  AKI or vice versa Acute renal failure- both pre- and post- improved with IVF and foley Shock state mostly hypovolemic improved Acute metabolic encephalopathy, uremic- nonfocal neuro exam, improving but not quite normal yet E coli UTI Muscular deconditioning POA Hypothyroidism- TSH ok Fe def and dilutional anemia  - f/u e coli susceptibility, remains on ceftriaxone , would do 5 days - Place foley - Additional hydration x 1 more day - Need to correct electrolyte abnormalities and see how kidneys settle out before deciding candidacy/need for PPM; potential EP consult tomorrow - AM iron studies - Up to chair  Rolan Sharps MD PCCM "

## 2024-11-20 NOTE — Progress Notes (Signed)
 eLink Physician-Brief Progress Note Patient Name: Shannon Blevins DOB: 12/28/1945 MRN: [id number]   Date of Service  11/20/2024  HPI/Events of Note  Presented with complete heart block status post TVP,  K 3.3 with Cr 5.40  eICU Interventions  KCl     Intervention Category Intermediate Interventions: Arrhythmia - evaluation and management  Jahari Wiginton 11/20/2024, 4:50 AM

## 2024-11-20 NOTE — TOC Initial Note (Addendum)
 Transition of Care [facility]) - Initial/Assessment Note    Patient Details  Name: Shannon Blevins MRN: [id number] Date of Birth: December 26, 1945  [facility] of [facility]) CM/SW Contact:    Jeoffrey LITTIE Moose, LCSWA Phone Number: 11/20/2024, 9:57 AM  Clinical Narrative:                 Pt admitted from home with spouse due to bruising over L eye and n/v/d. Pt currenty Ox1- CSW contacted pt daughter, Karna, and completed SNF workup over the phone. Karna stated pt lives at home with her husband who has dementia and is agreeable to SNF following hospital DC. Pt has never been to SNF before- CSW completed Fl2 and sent out SNF referrals. CSW will follow up to provide bed offers.   Expected Discharge Plan: Skilled Nursing Facility Barriers to Discharge: Continued Medical Work up, English As A Second Language Teacher, SNF Pending bed offer   Patient Goals and CMS Choice Patient states their goals for this hospitalization and ongoing recovery are:: SNF          Expected Discharge Plan and Services       Living arrangements for the past 2 months: Single Family Home                                      Prior Living Arrangements/Services Living arrangements for the past 2 months: Single Family Home Lives with:: Spouse Patient language and need for interpreter reviewed:: Yes Do you feel safe going back to the place where you live?: Yes      Need for Family Participation in Patient Care: Yes (Comment) Care giver support system in place?: Yes (comment)   Criminal Activity/Legal Involvement Pertinent to Current Situation/Hospitalization: No - Comment as needed  Activities of Daily Living      Permission Sought/Granted Permission sought to share information with : Facility Medical Sales Representative, Family Supports    Share Information with NAME: Karna  Permission granted to share info w AGENCY: SNFs  Permission granted to share info w Relationship: Daughter  Permission granted to share info w  Contact Information: [phone]  Emotional Assessment Appearance:: Appears stated age Attitude/Demeanor/Rapport: Unable to Assess Affect (typically observed): Unable to Assess Orientation: : Oriented to Self Alcohol / Substance Use: Not Applicable Psych Involvement: No (comment)  Admission diagnosis:  Heart block [I45.9] Symptomatic bradycardia [R00.1] Patient Active Problem List   Diagnosis Date Noted   Heart block 11/17/2024   Symptomatic bradycardia 11/17/2024   PCP:  Valma Carwin, MD Pharmacy:   [facility] [id number] ([address]), [place] - [address] [place] ([place]) [place] [address] Phone: [phone] Fax: [phone]     Social Drivers of Health (SDOH) Social History: SDOH Screenings   Tobacco Use: Medium Risk (11/17/2024)   SDOH Interventions:     Readmission Risk Interventions     No data to display

## 2024-11-21 DIAGNOSIS — R001 Bradycardia, unspecified: Secondary | ICD-10-CM

## 2024-11-21 DIAGNOSIS — I459 Conduction disorder, unspecified: Secondary | ICD-10-CM | POA: Diagnosis not present

## 2024-11-21 LAB — IRON AND TIBC
Iron: 29 ug/dL (ref 28–170)
Saturation Ratios: 14 % (ref 10.4–31.8)
TIBC: 214 ug/dL — ABNORMAL LOW (ref 250–450)
UIBC: 185 ug/dL

## 2024-11-21 LAB — RENAL FUNCTION PANEL
Albumin: 2.7 g/dL — ABNORMAL LOW (ref 3.5–5.0)
Anion gap: 10 (ref 5–15)
BUN: 76 mg/dL — ABNORMAL HIGH (ref 8–23)
CO2: 22 mmol/L (ref 22–32)
Calcium: 8.1 mg/dL — ABNORMAL LOW (ref 8.9–10.3)
Chloride: 111 mmol/L (ref 98–111)
Creatinine, Ser: 4.24 mg/dL — ABNORMAL HIGH (ref 0.44–1.00)
GFR, Estimated: 10 mL/min — ABNORMAL LOW
Glucose, Bld: 104 mg/dL — ABNORMAL HIGH (ref 70–99)
Phosphorus: 3.5 mg/dL (ref 2.5–4.6)
Potassium: 3.9 mmol/L (ref 3.5–5.1)
Sodium: 142 mmol/L (ref 135–145)

## 2024-11-21 LAB — CBC
HCT: 22.4 % — ABNORMAL LOW (ref 36.0–46.0)
Hemoglobin: 7.1 g/dL — ABNORMAL LOW (ref 12.0–15.0)
MCH: 30.1 pg (ref 26.0–34.0)
MCHC: 31.7 g/dL (ref 30.0–36.0)
MCV: 94.9 fL (ref 80.0–100.0)
Platelets: 169 K/uL (ref 150–400)
RBC: 2.36 MIL/uL — ABNORMAL LOW (ref 3.87–5.11)
RDW: 13.2 % (ref 11.5–15.5)
WBC: 6.1 K/uL (ref 4.0–10.5)
nRBC: 0 % (ref 0.0–0.2)

## 2024-11-21 LAB — URINE CULTURE: Culture: >=100000 — AB

## 2024-11-21 LAB — MAGNESIUM: Magnesium: 1.7 mg/dL (ref 1.7–2.4)

## 2024-11-21 LAB — ABO/RH: ABO/RH(D): O POS

## 2024-11-21 LAB — FERRITIN: Ferritin: 460 ng/mL — ABNORMAL HIGH (ref 11–307)

## 2024-11-21 LAB — PREPARE RBC (CROSSMATCH)

## 2024-11-21 MED ORDER — LACTATED RINGERS IV SOLN: INTRAVENOUS | Status: AC

## 2024-11-21 MED ORDER — ATORVASTATIN CALCIUM 10 MG PO TABS
10.0000 mg | ORAL_TABLET | Freq: Every evening | ORAL | Status: DC
  Administered 2024-11-21 – 2024-11-25 (×5): 10 mg via ORAL
  Filled 2024-11-21 (×5): qty 1

## 2024-11-21 MED ORDER — ACETAMINOPHEN 500 MG PO TABS
1000.0000 mg | ORAL_TABLET | Freq: Four times a day (QID) | ORAL | Status: DC | PRN
  Administered 2024-11-21 – 2024-11-26 (×10): 1000 mg via ORAL
  Filled 2024-11-21 (×12): qty 2

## 2024-11-21 MED ORDER — SODIUM CHLORIDE 0.9% IV SOLUTION: Freq: Once | INTRAVENOUS | Status: AC

## 2024-11-21 MED ADMIN — Magnesium Sulfate IV Soln 4 GM/100ML (40 MG/ML): 4 g | INTRAVENOUS | NDC 70121172001

## 2024-11-21 MED FILL — Magnesium Sulfate IV Soln 4 GM/100ML (40 MG/ML): 4.0000 g | INTRAVENOUS | Qty: 100 | Status: AC

## 2024-11-21 NOTE — Progress Notes (Signed)
 Physical Therapy Treatment Patient Details Name: Shannon Blevins MRN: [id number] DOB: 04-02-1946 Today's Date: 11/21/2024   History of Present Illness Pt is a 78 yo female who presented to [facility] on 12/18 after EMS arrived at home to assess pt's husband and found pt had fallen at home, striking her face, was hypotensive, had increased confusion, and was noted to have several days of nausea, vomiting, and diarrhea. Pt admitted with symptomatic bradycardia, acute renal failure, and uremic encephalopathy. Pt s/p TVP placement on 12/18. PMH: HTN, HLD, hypothyroid, depression, IDA, asthma, iron deficiency anemia    PT Comments  The pt was agreeable to session, reports limited OOB mobility since last PT session. The pt demos improved ability to complete bed mobility, maintain sitting balance, and complete sit-stand transfers, but remains limited in activity tolerance due to fatigue and dizziness. Pt with soft BP throughout, low of 92/53 (65) in standing and pt unable to maintain upright >1 min or progress ambulation distance. Upon return to sitting, BP initially 130/110 (118) with re-cycle of 146/129 (136). I then adjusted the BP cuff and elevated pt's legs and BP was then 100/61 (69). Pt denies change in sx during mobility. Recommendation remains appropriate at this time, will continue efforts acutely.    If plan is discharge home, recommend the following: Two people to help with walking and/or transfers;Two people to help with bathing/dressing/bathroom;Assistance with cooking/housework;Help with stairs or ramp for entrance   Can travel by private vehicle     No  Equipment Recommendations  Rolling walker (2 wheels)    Recommendations for Other Services       Precautions / Restrictions Precautions Precautions: Fall;Other (comment) Recall of Precautions/Restrictions: Impaired Precaution/Restrictions Comments: TVP, fell during admission 12/19 Restrictions Weight Bearing Restrictions Per Provider  Order: No     Mobility  Bed Mobility Overal bed mobility: Needs Assistance Bed Mobility: Supine to Sit     Supine to sit: Contact guard, HOB elevated, Used rails     General bed mobility comments: increased time and use of rails, no physical assist needed    Transfers Overall transfer level: Needs assistance Equipment used: Rolling walker (2 wheels) Transfers: Sit to/from Stand, Bed to chair/wheelchair/BSC Sit to Stand: Min assist   Step pivot transfers: Min assist       General transfer comment: cues for technique, dependent on BUE support, able to tolerate standing BP but not maintain upright for more than 1 min to take BP.    Ambulation/Gait Ambulation/Gait assistance: Min assist Gait Distance (Feet): 2 Feet Assistive device: Rolling walker (2 wheels) Gait Pattern/deviations: Step-to pattern Gait velocity: decreased Gait velocity interpretation: <1.31 ft/sec, indicative of household ambulator   General Gait Details: small lateral steps from EOB to chair, pt declines further attempts due to dizziness     Balance Overall balance assessment: Needs assistance Sitting-balance support: Single extremity supported, Bilateral upper extremity supported, Feet supported Sitting balance-Leahy Scale: Fair     Standing balance support: Bilateral upper extremity supported, During functional activity Standing balance-Leahy Scale: Poor Standing balance comment: dependent on BUE support, less posterior lean today                            Communication Communication Communication: No apparent difficulties  Cognition Arousal: Alert Behavior During Therapy: Flat affect   PT - Cognitive impairments: No family/caregiver present to determine baseline, Memory, Attention, Problem solving, Safety/Judgement  PT - Cognition Comments: pt with flat affect, needing increased processing time Following commands: Impaired Following commands  impaired: Only follows one step commands consistently, Follows multi-step commands inconsistently, Follows multi-step commands with increased time    Cueing Cueing Techniques: Verbal cues, Gestural cues, Visual cues  Exercises General Exercises - Lower Extremity Ankle Circles/Pumps: AROM, Both, 10 reps Quad Sets: AROM, Both, 10 reps Heel Slides: AROM, Both, 10 reps    General Comments General comments (skin integrity, edema, etc.): Bp soft, MAP >65. BP briefly elevated after return to sitting but re-cycled and 100/61      Pertinent Vitals/Pain Pain Assessment Pain Assessment: 0-10 Pain Score: 5  Pain Location: headache Pain Descriptors / Indicators: Headache Pain Intervention(s): Limited activity within patient's tolerance, Monitored during session, Repositioned     PT Goals (current goals can now be found in the care plan section) Acute Rehab PT Goals Patient Stated Goal: to return home PT Goal Formulation: With patient Time For Goal Achievement: 12/03/24 Potential to Achieve Goals: Good Progress towards PT goals: Progressing toward goals    Frequency    Min 2X/week       AM-PAC PT 6 Clicks Mobility   Outcome Measure  Help needed turning from your back to your side while in a flat bed without using bedrails?: A Little Help needed moving from lying on your back to sitting on the side of a flat bed without using bedrails?: A Little Help needed moving to and from a bed to a chair (including a wheelchair)?: A Lot Help needed standing up from a chair using your arms (e.g., wheelchair or bedside chair)?: A Lot Help needed to walk in hospital room?: Total Help needed climbing 3-5 steps with a railing? : Total 6 Click Score: 12    End of Session Equipment Utilized During Treatment: Gait belt Activity Tolerance: Patient tolerated treatment well;Patient limited by fatigue (dizzy with standing) Patient left: in chair;with call bell/phone within reach;with chair alarm  set;with nursing/sitter in room Nurse Communication: Mobility status PT Visit Diagnosis: Unsteadiness on feet (R26.81);Repeated falls (R29.6);Muscle weakness (generalized) (M62.81)     Time: 8563-8541 PT Time Calculation (min) (ACUTE ONLY): 22 min  Charges:    $Therapeutic Exercise: 8-22 mins PT General Charges $$ ACUTE PT VISIT: 1 Visit                     Izetta Call, PT, DPT   Acute Rehabilitation Department Office [phone] Secure Chat Communication Preferred   Izetta JULIANNA Call 11/21/2024, 3:54 PM

## 2024-11-21 NOTE — Progress Notes (Addendum)
 "    Advanced Heart Failure Rounding Note  Cardiologist: None   Chief Complaint: Bradycardia  Patient Profile   Shannon Blevins is a 78 y.o. female with history of HTN, hyperlipidemia, iron deficiency anemia, hypothyroidism. No known cardiac history per patient (but history limited). Presented after a fall and subsequently admitted for symptomatic bradycardia and acute renal failure.   Significant events:   12/18 - Bradycardia with 2:1 AV block, s/p TVP  Subjective:    V paced at 70, underlying sinus brady 30s.   Scr further improved to 4.2.  Hgb slowly drifting down, 7.1 today  On ceftriaxone  for E Coli UTI.   Objective:   Weight Range: 71.6 kg Body mass index is 28.41 kg/m.   Vital Signs:   Temp:  [97.9 F (36.6 C)-98.4 F (36.9 C)] 98.1 F (36.7 C) (12/22 1136) Pulse Rate:  [68-75] 69 (12/22 1200) Resp:  [9-20] 14 (12/22 1200) BP: (88-124)/(41-106) 121/49 (12/22 1200) SpO2:  [91 %-100 %] 100 % (12/22 1200) Weight:  [71.6 kg] 71.6 kg (12/22 0500) Last BM Date : 11/18/24  Weight change: Filed Weights   11/19/24 0500 11/20/24 0500 11/21/24 0500  Weight: 68.1 kg 66.3 kg 71.6 kg    Intake/Output:   Intake/Output Summary (Last 24 hours) at 11/21/2024 1449 Last data filed at 11/21/2024 1100 Gross per 24 hour  Intake 2042.54 ml  Output 2080 ml  Net -37.46 ml     Physical Exam   General:  Lying comfortably in bed HEENT: L eye ecchymosis Neck: no JVD Cor: Regular rate & rhythm. No murmurs. Lungs: clear anteriorly Abdomen: soft, nontender, nondistended. Extremities: no edema Neuro: Alert. Seems less confused than previous days     Telemetry   V-paced 70, underlying sinus brady in the 30s  Labs   CBC Recent Labs    11/20/24 0357 11/21/24 0430  WBC 5.7 6.1  HGB 7.5* 7.1*  HCT 22.4* 22.4*  MCV 92.6 94.9  PLT 187 169   Basic Metabolic Panel Recent Labs    87/78/74 0357 11/21/24 0430  NA 140 142  K 3.3* 3.9  CL 110 111  CO2 20* 22   GLUCOSE 90 104*  BUN 89* 76*  CREATININE 5.40* 4.24*  CALCIUM  8.2* 8.1*  MG 2.2 1.7  PHOS 4.1 3.5   Liver Function Tests Recent Labs    11/20/24 0357 11/21/24 0430  ALBUMIN 2.7* 2.7*   No results for input(s): LIPASE, AMYLASE in the last 72 hours. Cardiac Enzymes No results for input(s): CKTOTAL, CKMB, CKMBINDEX, TROPONINI in the last 72 hours.  BNP: BNP (last 3 results) No results for input(s): BNP in the last 8760 hours.  ProBNP (last 3 results) Recent Labs    11/17/24 2032  PROBNP 2,351.0*     D-Dimer No results for input(s): DDIMER in the last 72 hours. Hemoglobin A1C No results for input(s): HGBA1C in the last 72 hours. Fasting Lipid Panel No results for input(s): CHOL, HDL, LDLCALC, TRIG, CHOLHDL, LDLDIRECT in the last 72 hours. Medications:   Scheduled Medications:  sodium chloride    Intravenous Once   atorvastatin   10 mg Oral QPM   brexpiprazole   0.5 mg Oral Daily   Chlorhexidine  Gluconate Cloth  6 each Topical Daily   ferrous sulfate   325 mg Oral Q breakfast   heparin   5,000 Units Subcutaneous Q8H   levothyroxine   137 mcg Oral QAC breakfast   PARoxetine   20 mg Oral Daily   thiamine   100 mg Oral Daily  Infusions:  cefTRIAXone  (ROCEPHIN )  IV Stopped (11/20/24 2136)   lactated ringers       PRN Medications: acetaminophen , docusate sodium , ondansetron  (ZOFRAN ) IV, mouth rinse, polyethylene glycol  Assessment/Plan  1. Bradycardia: 2:1 AVB in setting of AKI with atenolol and diltiazem use. Also sinus node dysfunction. No chest pain.  Echo shows preserved LV EF 55-60%, mild RV dilation with normal RV function. TSH was normal.  - Off nodal blockade.  - Continues with underlying sinus bradycardia in 30s.  - Consult EP regarding potential need for PPM 2. AKI: Scr in 7/24 was 1.2. Creatinine 3 afe weeks ago, up to 10.5 with BUN 124 on admission.  BC Powder use may play a role.  Also with bradycardia, nausea, vomiting and  diarrhea.  All these could have triggered a prerenal insult proceeding to ATN.  She additionally had a metabolic acidosis though K is normal.  - Scr improving with IVF. Continuing LR at 75 mL/hr through today - Off telmisartan/hydrochlorothiazide.  - No acute need for HD 3. Hypothyroidism: Continue Levoxyl , TSH is normal.  4. Hypokalemia/hypomagnesemia: -K 2.5 and Mag 1.2 on presentation  -Mag 1.7 today > give 4 gm IV 5. Atrial fibrillation: Noted to have some slow atrial fibrillation on telemetry that seems to have been transient.   - if AF recurs may need AC - no recurrence  6. E. Coli UTI - Treating with ceftriaxone   Discussed with CCM at bedside. CRITICAL CARE Performed by: COLLETTA SHAVER N   Total critical care time: 14 minutes  Critical care time was exclusive of separately billable procedures and treating other patients.  Critical care was necessary to treat or prevent imminent or life-threatening deterioration.  Critical care was time spent personally by me on the following activities: development of treatment plan with patient and/or surrogate as well as nursing, discussions with consultants, evaluation of patient's response to treatment, examination of patient, obtaining history from patient or surrogate, ordering and performing treatments and interventions, ordering and review of laboratory studies, ordering and review of radiographic studies, pulse oximetry and re-evaluation of patient's condition.   Length of Stay: 4  FINCH, LINDSAY N, PA-C  11/21/2024, 2:49 PM  Advanced Heart Failure Team Pager [phone] (M-F; 7a - 5p)   Please visit [email or web address]: For overnight coverage please call cardiology fellow first. If fellow not available call Shock/ECMO MD on call.  For ECMO / Mechanical Support (Impella, IABP, LVAD) issues call Shock / ECMO MD on call.    Agree with above  Renal function improving with hydration. Denies SOB  Remains with temporary pacing at 70.  Underlying sinus brady 30s.   General:  Sitting up in bed. No resp difficulty HEENT: normal + left eye ecchymosis Neck: supple. no JVD.  Cor: Regular rate & rhythm. No rubs, gallops or murmurs. Lungs: clear Abdomen: soft, nontender, nondistended.Good bowel sounds. Extremities: no cyanosis, clubbing, rash, edema Neuro: alert & orientedx3, cranial nerves grossly intact. moves all 4 extremities w/o difficulty. Affect pleasant  She has persistent sinus node dysfunction. Will need PPM.   Continue TVP at 70 to support BP. Threshold at 3mA. Will follow.  Consult EP.   Can stop IVF  CRITICAL CARE Performed by: Patryk Conant  Total critical care time: 33 minutes  Critical care time was exclusive of separately billable procedures and treating other patients.  Critical care was necessary to treat or prevent imminent or life-threatening deterioration.  Critical care was time spent personally by me (independent of midlevel providers or residents) on the  following activities: development of treatment plan with patient and/or surrogate as well as nursing, discussions with consultants, evaluation of patient's response to treatment, examination of patient, obtaining history from patient or surrogate, ordering and performing treatments and interventions, ordering and review of laboratory studies, ordering and review of radiographic studies, pulse oximetry and re-evaluation of patient's condition.  Toribio Fuel, MD  3:54 PM   "

## 2024-11-21 NOTE — Progress Notes (Signed)
" ° °  NAME:  Shannon Blevins, MRN:  [id number], DOB:  10-28-1946, LOS: 4 ADMISSION DATE:  11/17/2024, CONSULTATION DATE:  12/18 REFERRING MD:  Patsey, EDP CHIEF COMPLAINT:  bradycardia   History of Present Illness:  78 year old female with past medical history of asthma, hypertension, hypothyroidism, depression, iron deficiency anemia, hyperlipidemia who presents to the ER after fall. Daughter called EMS after patient fall at home, striking her face. Noted several days of nausea, vomiting, diarrhea. Daughter also notes more confusion over the past few weeks. In ED found to have heart blood with HR 30s, hypotensive. Was given 1mg  atropine  with no improvement. Given IVF. Cardiology consulted and taking to cath lab for TVP. CCM asked to admit.   Of note I-stat chem 8 w/ BUN >130, sCr 11.5. 2 weeks ago her labs showed BUN 14, sCr 3.05.   Pertinent  Medical History  Hypertension, HLD, hypothyroid, depression, IDA, asthma   Significant Hospital Events: Including procedures, antibiotic start and stop dates in addition to other pertinent events   12/18: admit for CHB, had TVP placed 12/22 IVF KVO. Renal fxn better. MS improved. Still req pacer   Interim History / Subjective:  No distress Reports posterior neck discomfort c/w chronic arthritis    Objective   Blood pressure 106/62, pulse 70, temperature 97.9 F (36.6 C), temperature source Oral, resp. rate 17, weight 71.6 kg, SpO2 99%.        Intake/Output Summary (Last 24 hours) at 11/21/2024 0914 Last data filed at 11/21/2024 0800 Gross per 24 hour  Intake 2351 ml  Output 2360 ml  Net -9 ml   Filed Weights   11/19/24 0500 11/20/24 0500 11/21/24 0500  Weight: 68.1 kg 66.3 kg 71.6 kg    Examination:  General resting in bed no distress HENT left eye ecchymosis MMM no JVD Pulm clear on room air speaking full sentences  Card paced at 70  Abd soft Ext warm and dry  Neuro oriented x 3 moves all ext.  Gu clear yellow    Resolved  Hospital Problem list   Hypovolemic shock Assessment & Plan:   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 unclear if this is primary issue that led to dehydration, AKI or vice versa Plan Cont tele  Venous pacer at 70 Hold BB Ensure K and Mg nml Additional recs per EP but looks like she will probably need PPM  Acute renal failure- both pre- and post- improved with IVF and foley Scr cont to improve.  Plan Keep euvolemic KVO IVF today->she's drinking well Keep foley for now  Strict I&O Renal dose meds Am chem  Acute metabolic encephalopathy, uremic- nonfocal neuro exam, improving plan Supportive care   HLD Plan Resume lipitor   E coli UTI Wbc nmlized; culture pan sensitive Plan Day 3 of 5 ceftriaxone    Muscular deconditioning POA Plan PT/OT mobilize   Hypothyroidism- TSH ok Plan Cont synthroid    Fe def and dilutional anemia Plan Cont ferrous sulfate    Chronic neck pain  Plan Takes extra strength tylenol  at home. Will resume   My time 26 min included: review of most recent records, direct face to face time obtaining history, performing physical exam, developing and documenting plan as well as discussing this plan with the patient and/or care givers.  [id number] on-going, stable,recovering or improving problem >25 min  "

## 2024-11-21 NOTE — Consult Note (Addendum)
 "   ELECTROPHYSIOLOGY CONSULT NOTE    Patient ID: JAONNA WORD MRN: [id number], DOB/AGE: 77/12/1945 77 y.o.  Admit date: 11/17/2024 Date of Consult: 11/21/2024  Primary Physician: Valma Carwin, MD Primary Cardiologist: None  Electrophysiologist: new  Referring Provider: @ATTENDING @  Patient Profile: Shannon Blevins is a 78 y.o. female with a history of HTN, HLD, IDA, hypothyroid, CKD who is being seen today for the evaluation of SND at the request of Dr. Claudene.  HPI:  Shannon Blevins is a 78 y.o. female with PMH as above who presented to [facility] ER after fall at home. She had episodes of N/V/D with more confusion noted by daughter.  Initial EKG with 2:1 HB with rates in 30s. Labs notable for Creat 11, BUN > 130, K 4, Mag 1.7. TSH 2.4. She was hypotensive and confused in ER and was taken urgently to cath lab for TVP insertion.   Home meds include dilt, atenolol, both of which have been held. She was given IVF. NE started 12/19, since weaned off.   Her mental status comes and goes per patient, verbalizes she is having a hard time remembering recent events. She lives with husband, stating he needs more care than she does. She denies chest pain, chest pressure, palpitations. She says her elbows and L knee are more painful than her face. She says that she went to see PCP recently for feeling unwell, did not have syncopal or presyncopal episodes at that time.     Labs Potassium3.9 (12/22 0430) Magnesium   1.7 (12/22 0430) Creatinine, ser  4.24* (12/22 0430) PLT  169 (12/22 0430) HGB  7.1* (12/22 0430) WBC 6.1 (12/22 0430)  .    Past Medical History:  Diagnosis Date   Allergy    Anxiety    Hypertension    Renal disorder    Thyroid disease      Surgical History:  Past Surgical History:  Procedure Laterality Date   ABDOMINAL HYSTERECTOMY     CATARACT EXTRACTION     LASIK     TEMPORARY PACEMAKER N/A 11/17/2024   Procedure: TEMPORARY PACEMAKER;  Surgeon: Wonda Sharper, MD;   Location: [facility] INVASIVE CV LAB;  Service: Cardiovascular;  Laterality: N/A;     Medications Prior to Admission  Medication Sig Dispense Refill Last Dose/Taking   Aspirin-Salicylamide-Caffeine (BC HEADACHE POWDER PO) Take 1 Application by mouth daily.   Past Week   atenolol (TENORMIN) 50 MG tablet Take 50 mg by mouth daily.   11/17/2024 Morning   atorvastatin  (LIPITOR) 10 MG tablet Take 10 mg by mouth every evening.   11/16/2024 Evening   butalbital-acetaminophen -caffeine (FIORICET WITH CODEINE) 50-325-40-30 MG per capsule Take 1 capsule by mouth every 4 (four) hours as needed for headache.   Unknown   clobetasol cream (TEMOVATE) 0.05 % Apply 1 Application topically daily as needed (for rash).   Past Week   estradiol (ESTRACE) 2 MG tablet Take 2 mg by mouth daily.   11/17/2024 Morning   levothyroxine  (SYNTHROID ) 125 MCG tablet Take 125 mcg by mouth daily before breakfast.   11/17/2024 Morning   PARoxetine  (PAXIL ) 20 MG tablet Take 20 mg by mouth daily.   Past Week   tacrolimus  (PROTOPIC ) 0.1 % ointment Apply topically 2 (two) times daily. (Patient taking differently: Apply 1 Application topically 2 (two) times daily as needed (for rash).) 100 g 0 Past Week   telmisartan-hydrochlorothiazide (MICARDIS HCT) 80-25 MG tablet Take 1 tablet by mouth daily.   11/17/2024 Morning   Vibegron (GEMTESA)  75 MG TABS Take 1 tablet by mouth daily.   11/17/2024 Morning   levothyroxine  (SYNTHROID , LEVOTHROID) 137 MCG tablet Take 137 mcg by mouth daily before breakfast. (Patient not taking: Reported on 11/17/2024)   Not Taking    Inpatient Medications:   atorvastatin   10 mg Oral QPM   brexpiprazole   0.5 mg Oral Daily   Chlorhexidine  Gluconate Cloth  6 each Topical Daily   ferrous sulfate   325 mg Oral Q breakfast   heparin   5,000 Units Subcutaneous Q8H   levothyroxine   137 mcg Oral QAC breakfast   PARoxetine   20 mg Oral Daily   thiamine   100 mg Oral Daily    Allergies: Allergies[1]  Family History  Problem  Relation Age of Onset   Cancer Mother    Hypertension Mother    Heart disease Mother    Alcohol abuse Father    Diabetes Paternal Grandmother    Cancer Paternal Grandmother      Physical Exam: Vitals:   11/21/24 0900 11/21/24 1000 11/21/24 1100 11/21/24 1136  BP: 106/62 (!) 108/57 103/67   Pulse: 70 69 75   Resp: 17 13 12    Temp:    98.1 F (36.7 C)  TempSrc:    Oral  SpO2: 99% 96% 97%   Weight:        GEN- delayed responses to questions, NAD, A&O x 3 HEENT: Normocephalic, L orbital bruising Lungs- CTAB, Normal effort.  Heart- Regular rate and rhythm, No M/G/R.  GI- Soft, NT, ND.  Extremities- No clubbing, cyanosis, or edema   Radiology/Studies: US  RENAL Result Date: 11/19/2024 EXAM: US  Retroperitoneum Complete, Renal. 11/19/2024 10:05:08 PM TECHNIQUE: Real-time ultrasonography of the kidneys  was performed. COMPARISON: None available CLINICAL HISTORY: 409830 AKI (acute kidney injury) 409830 AKI (acute kidney injury) FINDINGS: FINDINGS: RIGHT KIDNEY/URETER: Right kidney measures 9.2 x 4.6 x 4.5 cm. Calculated volume is 98 ml. Normal cortical echogenicity. No hydronephrosis. No calculus. No mass. LEFT KIDNEY/URETER: The left kidney measures 9.3 x 4.9 x 4.4 cm. The calculated volume is 105 ml. Normal cortical echogenicity. No hydronephrosis. No calculus. No mass. BLADDER: The bladder is decompressed by a foley catheter. IMPRESSION: 1. No acute findings. Electronically signed by: Oneil Devonshire MD 11/19/2024 10:11 PM EST RP Workstation: [id number]   DG CHEST PORT 1 VIEW Result Date: 11/18/2024 CLINICAL DATA:  Hypoxemia EXAM: PORTABLE CHEST 1 VIEW COMPARISON:  None Available. FINDINGS: Temporary pacing wire is noted extending over the right ventricle. Cardiac shadow is within normal limits. Aortic calcifications are seen without aneurysmal dilatation. Lungs are clear bilaterally. No bony abnormality is noted. IMPRESSION: No active disease. Electronically Signed   By: Oneil Devonshire M.D.    On: 11/18/2024 20:56   ECHOCARDIOGRAM COMPLETE Result Date: 11/17/2024    ECHOCARDIOGRAM REPORT   Patient Name:   Shannon Blevins Date of Exam: 11/17/2024 Medical Rec #:  [id number]       Height:       62.5 in Accession #:    [id number]      Weight:       148.0 lb Date of Birth:  05-23-1946        BSA:          1.692 m Patient Age:    78 years        BP:           120/88 mmHg Patient Gender: F               HR:  70 bpm. Exam Location:  Inpatient Procedure: 2D Echo (Both Spectral and Color Flow Doppler were utilized during            procedure). STAT ECHO Indications:    complete heart block  History:        Patient has no prior history of Echocardiogram examinations.                 Temporary pacemaker present, Arrythmias:Bradycardia; Risk                 Factors:Hypertension.  Sonographer:    Tinnie Barefoot RDCS Referring Phys: [id number] TINNIE FORBES FURTH IMPRESSIONS  1. Left ventricular ejection fraction, by estimation, is 50 to 55%. Left ventricular ejection fraction by 2D MOD biplane is 53.0 %. The left ventricle has low normal function. The left ventricle demonstrates regional wall motion abnormalities (see scoring diagram/findings for description). There is mild concentric left ventricular hypertrophy. Left ventricular diastolic parameters are consistent with Grade I diastolic dysfunction (impaired relaxation).  2. Right ventricular systolic function is normal. The right ventricular size is mildly enlarged. There is normal pulmonary artery systolic pressure.  3. Left atrial size was moderately dilated.  4. The mitral valve is normal in structure. No evidence of mitral valve regurgitation. No evidence of mitral stenosis.  5. The aortic valve is tricuspid. Aortic valve regurgitation is trivial. No aortic stenosis is present.  6. The inferior vena cava is normal in size with greater than 50% respiratory variability, suggesting right atrial pressure of 3 mmHg. FINDINGS  Left Ventricle: Left ventricular  ejection fraction, by estimation, is 50 to 55%. Left ventricular ejection fraction by 2D MOD biplane is 53.0 %. The left ventricle has low normal function. The left ventricle demonstrates regional wall motion abnormalities. The left ventricular internal cavity size was normal in size. There is mild concentric left ventricular hypertrophy. Left ventricular diastolic parameters are consistent with Grade I diastolic dysfunction (impaired relaxation).  LV Wall Scoring: The anterior septum is hypokinetic. The entire anterior wall, entire lateral wall, inferior septum, entire apex, and entire inferior wall are normal. Right Ventricle: The right ventricular size is mildly enlarged. No increase in right ventricular wall thickness. Right ventricular systolic function is normal. There is normal pulmonary artery systolic pressure. The tricuspid regurgitant velocity is 2.00  m/s, and with an assumed right atrial pressure of 3 mmHg, the estimated right ventricular systolic pressure is 19.0 mmHg. Left Atrium: Left atrial size was moderately dilated. Right Atrium: Right atrial size was normal in size. Pericardium: There is no evidence of pericardial effusion. Mitral Valve: The mitral valve is normal in structure. Mild mitral annular calcification. No evidence of mitral valve regurgitation. No evidence of mitral valve stenosis. Tricuspid Valve: The tricuspid valve is normal in structure. Tricuspid valve regurgitation is trivial. No evidence of tricuspid stenosis. Aortic Valve: The aortic valve is tricuspid. Aortic valve regurgitation is trivial. No aortic stenosis is present. Pulmonic Valve: The pulmonic valve was normal in structure. Pulmonic valve regurgitation is not visualized. No evidence of pulmonic stenosis. Aorta: The aortic root is normal in size and structure. Venous: The inferior vena cava is normal in size with greater than 50% respiratory variability, suggesting right atrial pressure of 3 mmHg. IAS/Shunts: No atrial  level shunt detected by color flow Doppler.  LEFT VENTRICLE PLAX 2D                        Biplane EF (MOD) LVIDd:  4.10 cm         LV Biplane EF:   Left LVIDs:         3.00 cm                          ventricular LV PW:         1.10 cm                          ejection LV IVS:        1.10 cm                          fraction by LVOT diam:     2.00 cm                          2D MOD LV SV:         46                               biplane is LV SV Index:   27                               53.0 %. LVOT Area:     3.14 cm LV IVRT:       162 msec        Diastology                                LV e' medial:  4.35 cm/s                                LV e' lateral: 5.66 cm/s LV Volumes (MOD) LV vol d, MOD    41.2 ml A2C: LV vol d, MOD    56.6 ml A4C: LV vol s, MOD    20.4 ml A2C: LV vol s, MOD    28.1 ml A4C: LV SV MOD A2C:   20.8 ml LV SV MOD A4C:   56.6 ml LV SV MOD BP:    26.9 ml RIGHT VENTRICLE          IVC RV Basal diam:  2.40 cm  IVC diam: 1.80 cm TAPSE (M-mode): 2.0 cm LEFT ATRIUM             Index        RIGHT ATRIUM           Index LA diam:        3.50 cm 2.07 cm/m   RA Area:     12.90 cm LA Vol (A2C):   57.2 ml 33.80 ml/m  RA Volume:   30.90 ml  18.26 ml/m LA Vol (A4C):   55.6 ml 32.86 ml/m LA Biplane Vol: 58.7 ml 34.69 ml/m  AORTIC VALVE LVOT Vmax:   84.60 cm/s LVOT Vmean:  50.700 cm/s LVOT VTI:    0.146 m  AORTA Ao Root diam: 2.90 cm Ao Asc diam:  3.40 cm TRICUSPID VALVE TR Peak grad:   16.0 mmHg TR Vmax:        200.00 cm/s  SHUNTS Systemic VTI:  0.15 m Systemic Diam: 2.00 cm Annabella Scarce MD  Electronically signed by Annabella Scarce MD Signature Date/Time: 11/17/2024/5:36:52 PM    Final    CARDIAC CATHETERIZATION Result Date: 11/17/2024 Successful placement of RIJ temporary pacing catheter   CT HEAD WO CONTRAST ( ) Result Date: 11/17/2024 EXAM: CT HEAD AND CERVICAL SPINE 11/17/2024 02:45:07 PM TECHNIQUE: CT of the head and cervical spine was performed without the administration of  intravenous contrast. Multiplanar reformatted images are provided for review. Automated exposure control, iterative reconstruction, and/or weight based adjustment of the mA/kV was utilized to reduce the radiation dose to as low as reasonably achievable. COMPARISON: None available. CLINICAL HISTORY: Head trauma, moderate-severe. FINDINGS: CT HEAD BRAIN AND VENTRICLES: Hypoattenuating foci in the cerebral white matter, most likely representing chronic small vessel disease. Prominence of the sulci and ventricles is compatible with brain atrophy. No acute intracranial hemorrhage. No mass effect or midline shift. No abnormal extra-axial fluid collection. No evidence of acute infarct. No hydrocephalus. ORBITS: No acute abnormality. SINUSES AND MASTOIDS: No acute abnormality. SOFT TISSUES AND SKULL: Small superficial soft tissue contusion within the left supraorbital region. No acute skull fracture. CT CERVICAL SPINE BONES AND ALIGNMENT: Stepwise 1st degree anterolisthesis of C4 on C5, C5 on C6, and C6 on C7. No acute fracture or traumatic malalignment. DEGENERATIVE CHANGES: Multilevel disc space narrowing and endplate spurring noted. SOFT TISSUES: No prevertebral soft tissue swelling. IMPRESSION: 1. No acute intracranial abnormality. 2. Chronic small vessel ischemic disease and brain atrophy. 3. Small superficial soft tissue contusion within the left supraorbital region. 4. No acute cervical spine fracture or traumatic malalignment. 5. Cervical spondylosis. Electronically signed by: Waddell Calk MD 11/17/2024 03:19 PM EST RP Workstation: [id number]   CT Cervical Spine Wo Contrast Result Date: 11/17/2024 EXAM: CT HEAD AND CERVICAL SPINE 11/17/2024 02:45:07 PM TECHNIQUE: CT of the head and cervical spine was performed without the administration of intravenous contrast. Multiplanar reformatted images are provided for review. Automated exposure control, iterative reconstruction, and/or weight based adjustment of the  mA/kV was utilized to reduce the radiation dose to as low as reasonably achievable. COMPARISON: None available. CLINICAL HISTORY: Head trauma, moderate-severe. FINDINGS: CT HEAD BRAIN AND VENTRICLES: Hypoattenuating foci in the cerebral white matter, most likely representing chronic small vessel disease. Prominence of the sulci and ventricles is compatible with brain atrophy. No acute intracranial hemorrhage. No mass effect or midline shift. No abnormal extra-axial fluid collection. No evidence of acute infarct. No hydrocephalus. ORBITS: No acute abnormality. SINUSES AND MASTOIDS: No acute abnormality. SOFT TISSUES AND SKULL: Small superficial soft tissue contusion within the left supraorbital region. No acute skull fracture. CT CERVICAL SPINE BONES AND ALIGNMENT: Stepwise 1st degree anterolisthesis of C4 on C5, C5 on C6, and C6 on C7. No acute fracture or traumatic malalignment. DEGENERATIVE CHANGES: Multilevel disc space narrowing and endplate spurring noted. SOFT TISSUES: No prevertebral soft tissue swelling. IMPRESSION: 1. No acute intracranial abnormality. 2. Chronic small vessel ischemic disease and brain atrophy. 3. Small superficial soft tissue contusion within the left supraorbital region. 4. No acute cervical spine fracture or traumatic malalignment. 5. Cervical spondylosis. Electronically signed by: Waddell Calk MD 11/17/2024 03:19 PM EST RP Workstation: [id number]    EKG: 11/17/2024 at 1401  - junctional bradycarid at 39bpm, IVCD(personally reviewed)  TELEMETRY: VP at 70 (personally reviewed)  DEVICE HISTORY: none  Assessment/Plan: #) SND s/p TVP #) AKI, improving #) anemia, ?dilution Presented to [facility] ER after syncopal event. Initial EKG concerning for junctional bradycardia. No previous EKG for comparison. She is s/p TVP, currently pacing at 70bpm Home meds include  diltiazem and atenolol, both held since admission TSH normal at admission Found to have significant AKI with Cr peaking at  11, improving to 4.2 today, unclear baseline She meets criteria for PPM implant, however would prefer to clinical improve prior to implantation  #) e. Coli UTI #) Leukocytosis Per primary    MD to see         For questions or updates, please contact [facility] Please consult [email or web address] for contact info under Cardiology/STEMI.  Signed, Chantal Needle, NP  11/21/2024 12:19 PM  I have seen, examined the patient, and reviewed the above assessment and plan.    HPI: Ms. Baylin Cabal is a 78 year old female with past medical history notable for asthma, hypertension, hypothyroidism, depression, iron deficiency anemia, hyperlipidemia who presented to the ED initially on 12/18 after a fall at home.  She had been experiencing nausea, vomiting, diarrhea in the days immediately preceding her admission, but had been feeling unwell for a few weeks, primarily with worsening fatigue and confusion.  She had seen her PCP approximately 2 weeks ago and was found to have acute rise in creatinine to 3.05.  Upon arrival to the ED she was found to have a creatinine of 11 and a BUN of greater than 130.  She was also bradycardic with complete heart block and temporary pacing wire was placed.  EP has been consulted for further management.  General: Well developed, in no acute distress.  Neck: No JVD. RIJ temp wire. Cardiac: Normal rate, regular rhythm.  Resp: Normal work of breathing.  Ext: No edema.  Neuro: No gross focal deficits.  Psych: Normal affect.   EKG 12/18:    Echo 12/18:  1. Left ventricular ejection fraction, by estimation, is 50 to 55%. Left  ventricular ejection fraction by 2D MOD biplane is 53.0 %. The left  ventricle has low normal function. The left ventricle demonstrates  regional wall motion abnormalities (see  scoring diagram/findings for description). There is mild concentric left  ventricular hypertrophy. Left ventricular diastolic parameters are  consistent with Grade  I diastolic dysfunction (impaired relaxation).   2. Right ventricular systolic function is normal. The right ventricular  size is mildly enlarged. There is normal pulmonary artery systolic  pressure.   3. Left atrial size was moderately dilated.   4. The mitral valve is normal in structure. No evidence of mitral valve  regurgitation. No evidence of mitral stenosis.   5. The aortic valve is tricuspid. Aortic valve regurgitation is trivial.  No aortic stenosis is present.   6. The inferior vena cava is normal in size with greater than 50%  respiratory variability, suggesting right atrial pressure of 3 mmHg.   Assessment: Ms. Tonimarie is a 78 year old female who presented to the ED with acute renal failure, found to have creatinine of 11 and a BUN of greater than 130 on admission.  She had reportedly been using BC powder regularly at home.  Also found to have downtrending hemoglobin and UTI.    She was bradycardic on presentation and a temporary pacing wire was implanted.  She had been taking diltiazem and atenolol at home.  Unclear if her bradycardia is primary or secondary.  There was no mention of bradycardia during her PCP visit where she was found to have AKI, so this would suggest more of a secondary process, although her rates remain slow despite holding her home meds.  When weaning the temp wire her underlying rates are sinus in the 30s.  Plan:  -  Continue to monitor for recovery of heart rates.  Leave temporary pacemaker in place and assess daily.  Will make decision regarding permanent pacemaker implant when other acute clinical issues have resolved such as renal failure and she has completed antibiotic treatment for infection.  Fonda Kitty, MD 11/21/2024 10:17 PM         [1]  Allergies Allergen Reactions   Sulfa Antibiotics Nausea And Vomiting   "

## 2024-11-21 NOTE — Plan of Care (Signed)

## 2024-11-21 NOTE — Progress Notes (Signed)
 eLink Physician-Brief Progress Note Patient Name: Shannon Blevins DOB: Sep 27, 1946 MRN: [id number]   Date of Service  11/21/2024  HPI/Events of Note  Mag 1.7   eICU Interventions  Magnesium  Sulphate     Intervention Category Minor Interventions: Electrolytes abnormality - evaluation and management  Justina Bertini 11/21/2024, 5:45 AM

## 2024-11-22 DIAGNOSIS — G9341 Metabolic encephalopathy: Secondary | ICD-10-CM | POA: Diagnosis not present

## 2024-11-22 DIAGNOSIS — I441 Atrioventricular block, second degree: Secondary | ICD-10-CM | POA: Diagnosis not present

## 2024-11-22 DIAGNOSIS — B9629 Other Escherichia coli [E. coli] as the cause of diseases classified elsewhere: Secondary | ICD-10-CM | POA: Diagnosis not present

## 2024-11-22 DIAGNOSIS — N179 Acute kidney failure, unspecified: Secondary | ICD-10-CM | POA: Diagnosis not present

## 2024-11-22 DIAGNOSIS — N390 Urinary tract infection, site not specified: Secondary | ICD-10-CM | POA: Diagnosis not present

## 2024-11-22 DIAGNOSIS — I459 Conduction disorder, unspecified: Secondary | ICD-10-CM | POA: Diagnosis not present

## 2024-11-22 LAB — TYPE AND SCREEN
ABO/RH(D): O POS
Antibody Screen: NEGATIVE
Unit division: 0

## 2024-11-22 LAB — CBC
HCT: 25.7 % — ABNORMAL LOW (ref 36.0–46.0)
Hemoglobin: 8.4 g/dL — ABNORMAL LOW (ref 12.0–15.0)
MCH: 30.7 pg (ref 26.0–34.0)
MCHC: 32.7 g/dL (ref 30.0–36.0)
MCV: 93.8 fL (ref 80.0–100.0)
Platelets: 173 K/uL (ref 150–400)
RBC: 2.74 MIL/uL — ABNORMAL LOW (ref 3.87–5.11)
RDW: 13.3 % (ref 11.5–15.5)
WBC: 6.5 K/uL (ref 4.0–10.5)
nRBC: 0 % (ref 0.0–0.2)

## 2024-11-22 LAB — BPAM RBC
Blood Product Expiration Date: 202601192359
ISSUE DATE / TIME: 202512221720
Unit Type and Rh: 5100

## 2024-11-22 LAB — RENAL FUNCTION PANEL
Albumin: 2.8 g/dL — ABNORMAL LOW (ref 3.5–5.0)
Anion gap: 10 (ref 5–15)
BUN: 55 mg/dL — ABNORMAL HIGH (ref 8–23)
CO2: 22 mmol/L (ref 22–32)
Calcium: 8.5 mg/dL — ABNORMAL LOW (ref 8.9–10.3)
Chloride: 108 mmol/L (ref 98–111)
Creatinine, Ser: 3.34 mg/dL — ABNORMAL HIGH (ref 0.44–1.00)
GFR, Estimated: 14 mL/min — ABNORMAL LOW
Glucose, Bld: 101 mg/dL — ABNORMAL HIGH (ref 70–99)
Phosphorus: 3.5 mg/dL (ref 2.5–4.6)
Potassium: 3.6 mmol/L (ref 3.5–5.1)
Sodium: 140 mmol/L (ref 135–145)

## 2024-11-22 LAB — MAGNESIUM: Magnesium: 2.5 mg/dL — ABNORMAL HIGH (ref 1.7–2.4)

## 2024-11-22 MED ORDER — POTASSIUM CHLORIDE 20 MEQ PO PACK
40.0000 meq | PACK | Freq: Once | ORAL | Status: AC
  Administered 2024-11-22: 40 meq via ORAL
  Filled 2024-11-22: qty 2

## 2024-11-22 NOTE — Progress Notes (Addendum)
 "  Patient Name: Shannon Blevins Date of Encounter: 11/22/2024  Primary Cardiologist: None Electrophysiologist: None  Interval Summary   NAEON Per nursing, mental status continues to improve daily  She has no complaints this AM, denies chest pain, chest pressure, or palpitations.   Vital Signs    Vitals:   11/22/24 0600 11/22/24 0700 11/22/24 0708 11/22/24 0800  BP: 133/61 (!) 146/62  139/61  Pulse: 76 73  70  Resp: 15 11  11   Temp:   98.9 F (37.2 C)   TempSrc:   Axillary   SpO2: 95% 99%  97%  Weight:        Intake/Output Summary (Last 24 hours) at 11/22/2024 0908 Last data filed at 11/22/2024 0800 Gross per 24 hour  Intake 1800.89 ml  Output 2530 ml  Net -729.11 ml   Filed Weights   11/20/24 0500 11/21/24 0500 11/22/24 0500  Weight: 66.3 kg 71.6 kg 73.5 kg    Physical Exam    GEN- NAD, Alert and oriented  Lungs- Clear to ausculation bilaterally, normal work of breathing Cardiac- Regular rate and rhythm, no murmurs, rubs or gallops GI- soft, NT, ND, + BS Extremities- no clubbing or cyanosis. No edema  Telemetry    After reducing rate - sinus rhythm in 60s (personally reviewed)  Hospital Course    Ruhama A Biondo is a 78 y.o. female with PMH of HTN, HLD, IDA, hypothyroid, CKD admitted for fall at home, found to be profoundly bradycardic with Cr of 11/BUN 130  Assessment & Plan    #) SND vs junctional bradycardia  #) TVP in place #) AKI, improving Sinus rhythm w 1st deg HB this AM Reduced TVP rate to VVI 45 Threshold ~46mV Cr continues to improve though remains quite elevated. EP will continue to assess conduction daily to assess for PPM needs  #) anemia #) UTI Per primary      For questions or updates, please contact [facility] Please consult [email or web address] for contact info under     Signed, Suzann Riddle, NP  11/22/2024, 9:08 AM    I have seen, examined the patient, and reviewed the above assessment and plan.    Interval: No  acute overnight events. Patient reports feeling relatively well. No new or acute complaints.   General: Well developed, in no acute distress.  Neck: No JVD.  Cardiac: Normal rate, regular rhythm.  Resp: Normal work of breathing.  Ext: No edema.  Neuro: No gross focal deficits.  Psych: Normal affect.   Assessment:  Shannon Blevins is a 78 year old female who presented to the ED with acute renal failure, found to have creatinine of 11 and a BUN of greater than 130 on admission.  She had reportedly been using BC powder regularly at home.  Also found to have downtrending hemoglobin and UTI.     She was bradycardic on presentation and a temporary pacing wire was implanted.  She had been taking diltiazem and atenolol at home.  Unclear if her bradycardia is primary or secondary.  There was no mention of bradycardia during her PCP visit where she was found to have AKI, so this would suggest more of a secondary process, although her rates remain slow despite holding her home meds.    When weaning the temp wire today, she had underling sinus rhythm near 60bpm with 1:1 AV conduction. Backup pacing decreased to VVI 45bpm. Threshold 4.0.   Problem List:  Complete heart block Symptomatic bradycardia  Plan:  - Continue to  monitor for recovery of heart rates. Today, she had stable 1:1 conduction with rates in the 60s.  Leave temporary pacemaker in place for today.  Will make decision regarding permanent pacemaker implant when other acute clinical issues have resolved such as renal failure and she has completed antibiotic treatment for infection.   Fonda Kitty, MD, [facility], [facility] Cardiac Electrophysiology  "

## 2024-11-22 NOTE — Progress Notes (Signed)
 "  NAME:  Shannon Blevins, MRN:  [id number], DOB:  1946/10/15, LOS: 5 ADMISSION DATE:  11/17/2024, CONSULTATION DATE:  12/18 REFERRING MD:  Patsey, EDP CHIEF COMPLAINT:  bradycardia   History of Present Illness:  78 year old female with past medical history of asthma, hypertension, hypothyroidism, depression, iron deficiency anemia, hyperlipidemia who presents to the ER after fall. Daughter called EMS after patient fall at home, striking her face. Noted several days of nausea, vomiting, diarrhea. Daughter also notes more confusion over the past few weeks. In ED found to have heart blood with HR 30s, hypotensive. Was given 1mg  atropine  with no improvement. Given IVF. Cardiology consulted and taking to cath lab for TVP. CCM asked to admit.   Of note I-stat chem 8 w/ BUN >130, sCr 11.5. 2 weeks ago her labs showed BUN 14, sCr 3.05.   Pertinent  Medical History  Hypertension, HLD, hypothyroid, depression, IDA, asthma   Significant Hospital Events: Including procedures, antibiotic start and stop dates in addition to other pertinent events   12/18: admit for CHB, had TVP placed 12/22 IVF KVO. Renal fxn better. MS improved. Still req pacer  12/23 still requires intermittent pacing with rate turndown   Interim History / Subjective:  No acute events, reports feeling well Good appetite, no chest pain or shortness of breath    Objective   Blood pressure (!) 133/55, pulse 79, temperature 98.9 F (37.2 C), temperature source Axillary, resp. rate 12, weight 73.5 kg, SpO2 97%.        Intake/Output Summary (Last 24 hours) at 11/22/2024 1022 Last data filed at 11/22/2024 0800 Gross per 24 hour  Intake 1760.3 ml  Output 2530 ml  Net -769.7 ml   Filed Weights   11/20/24 0500 11/21/24 0500 11/22/24 0500  Weight: 66.3 kg 71.6 kg 73.5 kg    General:  well nourished elderly female, sitting up in bed in no acute distress HEENT: MM pink/moist, healing ecchymosis of the left periorbital  region Neuro: Alert and oriented x 2, moving all extremities CV: s1s2 intermittently paced, underlying rhythm first-degree heart block with rate in the 30-40s, no m/r/g PULM: Clear bilaterally on room air GI: soft, nondistended   Labs Creatinine slowly improving 3.34 down from 4.24 yesterday, 2.6 L urine output yesterday Magnesium  2.5 Hemoglobin 8.4 K3.6  Resolved Hospital Problem list   Hypovolemic shock Assessment & Plan:   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  unclear if this is primary issue that led to dehydration, AKI or vice versa -Cont tele  -still requiring venous pacing intermittently, EP following and turned TVP rate down to 45, will continue to follow for PPM need -continue to hold BB -trend and replete electrolytes  Acute renal failure-  -continues to improve with >2L UOP yesterday -Keep euvolemic -encourage po intake  -Keep foley for now  -Strict I&O -Renal dose meds   Acute metabolic encephalopathy, uremic-  - Continues to improve, supportive care  HLD -Continue lipitor   E coli UTI Wbc nmlized; culture pan sensitive -Day 3 of 5 ceftriaxone    Muscular deconditioning POA -PT/OT mobilize   Hypothyroidism- TSH ok -Cont synthroid    Fe def and dilutional anemia -Cont ferrous sulfate    Chronic neck pain  -Continue Tylenol   CRITICAL CARE Performed by: Leita SAUNDERS Lynna Zamorano   Total critical care time: 40 minutes  Critical care time was exclusive of separately billable procedures and treating other patients.  Critical care was necessary to treat or prevent imminent or life-threatening deterioration.  Critical care was time  spent personally by me on the following activities: development of treatment plan with patient and/or surrogate as well as nursing, discussions with consultants, evaluation of patient's response to treatment, examination of patient, obtaining history from patient or surrogate, ordering and performing treatments and interventions, ordering and  review of laboratory studies, ordering and review of radiographic studies, pulse oximetry and re-evaluation of patient's condition.    Leita SAUNDERS Darrelyn Morro, PA-C [facility] See Amion for pager If no response to pager , please call [phone] until 7pm After 7:00 pm call Elink  [phone]  "

## 2024-11-22 NOTE — Progress Notes (Signed)
 Occupational Therapy Treatment Patient Details Name: Shannon Blevins MRN: [id number] DOB: [birth year] Today's Date: 11/22/2024   History of present illness Pt is a 78 yo female who presented to [facility] on 12/18 after EMS arrived at home to assess pt's husband and found pt had fallen at home, striking her face, was hypotensive, had increased confusion, and was noted to have several days of nausea, vomiting, and diarrhea. Pt admitted with symptomatic bradycardia, acute renal failure, and uremic encephalopathy. Pt s/p TVP placement on 12/18. PMH: HTN, HLD, hypothyroid, depression, IDA, asthma, iron deficiency anemia   OT comments  Pt progressing toward goals, seen up in chair after sitting up since earlier this AM. Pt able to perform simulated toilet transfer with min A and use of RW. RN present to manage pacer if needed, however HR remained in 80s throughout session. Pt min A for bed mobility and able to participate in seated EOB therex. Pt presenting with impairments listed below, will follow acutely. Patient will benefit from continued inpatient follow up therapy, <3 hours/day to maximize safety/ind with ADL/functional mobility.       If plan is discharge home, recommend the following:  Two people to help with bathing/dressing/bathroom;Assistance with cooking/housework;Direct supervision/assist for medications management;Direct supervision/assist for financial management;Assist for transportation;Help with stairs or ramp for entrance;Supervision due to cognitive status;A lot of help with walking and/or transfers   Equipment Recommendations  None recommended by OT    Recommendations for Other Services      Precautions / Restrictions Precautions Precautions: Fall;Other (comment) Recall of Precautions/Restrictions: Impaired Precaution/Restrictions Comments: TVP, fell during admission 12/19 Restrictions Weight Bearing Restrictions Per Provider Order: No       Mobility Bed Mobility Overal bed  mobility: Needs Assistance Bed Mobility: Sit to Supine       Sit to supine: Min assist        Transfers Overall transfer level: Needs assistance Equipment used: Rolling walker (2 wheels) Transfers: Sit to/from Stand, Bed to chair/wheelchair/BSC Sit to Stand: Min assist     Step pivot transfers: Min assist           Balance Overall balance assessment: Needs assistance Sitting-balance support: Single extremity supported, Bilateral upper extremity supported, Feet supported Sitting balance-Leahy Scale: Fair     Standing balance support: Bilateral upper extremity supported, During functional activity Standing balance-Leahy Scale: Poor Standing balance comment: dependent on BUE support, less posterior lean today                           ADL either performed or assessed with clinical judgement   ADL Overall ADL's : Needs assistance/impaired                         Toilet Transfer: Minimal assistance;Stand-pivot;Rolling walker (2 wheels);BSC/3in1 Toilet Transfer Details (indicate cue type and reason): simulated recliner > bed                Extremity/Trunk Assessment Upper Extremity Assessment Upper Extremity Assessment: Generalized weakness   Lower Extremity Assessment Lower Extremity Assessment: Defer to PT evaluation        Vision   Vision Assessment?: No apparent visual deficits Additional Comments: bruising noted around L eye   Perception Perception Perception: Not tested   Praxis Praxis Praxis: Not tested   Communication Communication Communication: No apparent difficulties   Cognition Arousal: Alert Behavior During Therapy: Flat affect Cognition: No apparent impairments  Following commands impaired: Follows one step commands with increased time      Cueing   Cueing Techniques: Verbal cues, Gestural cues, Visual cues  Exercises Exercises: Other exercises Other  Exercises Other Exercises: seated dorsiflexion x10 Other Exercises: seated leg kicks x10 Other Exercises: seated functional reach x10    Shoulder Instructions       General Comments      Pertinent Vitals/ Pain       Pain Assessment Pain Assessment: Faces Pain Score: 4  Faces Pain Scale: Hurts little more Pain Location: L knee with ROM Pain Descriptors / Indicators: Discomfort Pain Intervention(s): Limited activity within patient's tolerance, Monitored during session, Repositioned  Home Living                                          Prior Functioning/Environment              Frequency  Min 2X/week        Progress Toward Goals  OT Goals(current goals can now be found in the care plan section)  Progress towards OT goals: Progressing toward goals  Acute Rehab OT Goals Patient Stated Goal: none stated OT Goal Formulation: With patient Time For Goal Achievement: 12/03/24 Potential to Achieve Goals: Good ADL Goals Pt Will Perform Grooming: with supervision;standing Pt Will Perform Upper Body Bathing: with contact guard assist;sitting Pt Will Perform Lower Body Dressing: with min assist;sitting/lateral leans;sit to/from stand Pt Will Transfer to Toilet: with supervision;ambulating;regular height toilet Pt Will Perform Toileting - Clothing Manipulation and hygiene: with contact guard assist;sitting/lateral leans;sit to/from stand  Plan      Co-evaluation                 AM-PAC OT 6 Clicks Daily Activity     Outcome Measure   Help from another person eating meals?: A Little Help from another person taking care of personal grooming?: A Little Help from another person toileting, which includes using toliet, bedpan, or urinal?: A Lot Help from another person bathing (including washing, rinsing, drying)?: A Lot Help from another person to put on and taking off regular upper body clothing?: A Little Help from another person to put on and  taking off regular lower body clothing?: A Lot 6 Click Score: 15    End of Session Equipment Utilized During Treatment: Rolling walker (2 wheels)  OT Visit Diagnosis: Unsteadiness on feet (R26.81);History of falling (Z91.81);Muscle weakness (generalized) (M62.81);Other symptoms and signs involving cognitive function   Activity Tolerance Patient tolerated treatment well   Patient Left in bed;with call bell/phone within reach;with bed alarm set   Nurse Communication Mobility status;Precautions        Time: 8667-8653 OT Time Calculation (min): 14 min  Charges: OT General Charges $OT Visit: 1 Visit OT Treatments $Therapeutic Activity: 8-22 mins  Maryhelen Lindler K, OTD, OTR/L SecureChat Preferred Acute Rehab [phone]   Shannon Blevins 11/22/2024, 1:51 PM

## 2024-11-22 NOTE — Progress Notes (Signed)
 Physical Therapy Treatment Patient Details Name: Shannon Blevins MRN: [id number] DOB: [birth year] Today's Date: 11/22/2024   History of Present Illness Pt is a 78 yo female who presented to [facility] on 12/18 after EMS arrived at home to assess pt's husband and found pt had fallen at home, striking her face, was hypotensive, had increased confusion, and was noted to have several days of nausea, vomiting, and diarrhea. Pt admitted with symptomatic bradycardia, acute renal failure, and uremic encephalopathy. Pt s/p TVP placement on 12/18. PMH: HTN, HLD, hypothyroid, depression, IDA, asthma, iron deficiency anemia    PT Comments  The pt was agreeable to session with focus on progressing mobility. Limited by frequent episodes of bradycardia lasting 5-15 seconds before HR returned to 70s. With continued activity frequency and duration of bradycardia episodes increased. RN present and increased TVP to pacing rate of 80bpm and rest of session. Pt able to progress ambulation distance with less reports of dizziness, completed 8 ft in room with BUE support. Pt continues to need minA to maintain balance and is dependent on BUE support. Recommendations remain appropriate at this time.     If plan is discharge home, recommend the following: Two people to help with walking and/or transfers;Two people to help with bathing/dressing/bathroom;Assistance with cooking/housework;Help with stairs or ramp for entrance   Can travel by private vehicle     No  Equipment Recommendations  Rolling walker (2 wheels)    Recommendations for Other Services       Precautions / Restrictions Precautions Precautions: Fall;Other (comment) Recall of Precautions/Restrictions: Impaired Precaution/Restrictions Comments: TVP, fell during admission 12/19 Restrictions Weight Bearing Restrictions Per Provider Order: No     Mobility  Bed Mobility Overal bed mobility: Needs Assistance Bed Mobility: Supine to Sit     Supine to sit:  Contact guard, HOB elevated, Used rails     General bed mobility comments: increased time and use of rails, no physical assist needed    Transfers Overall transfer level: Needs assistance Equipment used: Rolling walker (2 wheels) Transfers: Sit to/from Stand, Bed to chair/wheelchair/BSC Sit to Stand: Min assist   Step pivot transfers: Min assist       General transfer comment: cues for technique, dependent on BUE support, able to tolerate standing BP, minA to steady with turn to chair    Ambulation/Gait Ambulation/Gait assistance: Min assist Gait Distance (Feet): 8 Feet Assistive device: Rolling walker (2 wheels) Gait Pattern/deviations: Step-through pattern, Decreased stride length, Trunk flexed, Shuffle Gait velocity: decreased Gait velocity interpretation: <1.31 ft/sec, indicative of household ambulator   General Gait Details: pt with small shufflign steps with minimal clearance. no overt buckling but reports fatigue. no dizziness today and BP stable   Stairs             Wheelchair Mobility     Tilt Bed    Modified Rankin (Stroke Patients Only)       Balance Overall balance assessment: Needs assistance Sitting-balance support: Single extremity supported, Bilateral upper extremity supported, Feet supported Sitting balance-Leahy Scale: Fair     Standing balance support: Bilateral upper extremity supported, During functional activity Standing balance-Leahy Scale: Poor Standing balance comment: dependent on BUE support, less posterior lean today                            Communication Communication Communication: No apparent difficulties  Cognition Arousal: Alert Behavior During Therapy: Flat affect   PT - Cognitive impairments: No family/caregiver present to  determine baseline, Memory, Attention, Problem solving, Safety/Judgement                       PT - Cognition Comments: pt with flat affect, needing increased processing  time Following commands: Impaired Following commands impaired: Only follows one step commands consistently, Follows multi-step commands inconsistently, Follows multi-step commands with increased time    Cueing Cueing Techniques: Verbal cues, Gestural cues, Visual cues  Exercises General Exercises - Lower Extremity Ankle Circles/Pumps: AROM, Both, 10 reps Quad Sets: AROM, Both, 10 reps Heel Slides: AROM, Both, 10 reps    General Comments        Pertinent Vitals/Pain Pain Assessment Pain Assessment: Faces Faces Pain Scale: Hurts a little bit Pain Location: R hip, L knee with gait, and R side of neck (arthritis) Pain Descriptors / Indicators: Sore Pain Intervention(s): Limited activity within patient's tolerance, Monitored during session, Repositioned    Home Living                          Prior Function            PT Goals (current goals can now be found in the care plan section) Acute Rehab PT Goals Patient Stated Goal: to return home PT Goal Formulation: With patient Time For Goal Achievement: 12/03/24 Potential to Achieve Goals: Good Progress towards PT goals: Progressing toward goals    Frequency    Min 2X/week      PT Plan      Co-evaluation              AM-PAC PT 6 Clicks Mobility   Outcome Measure  Help needed turning from your back to your side while in a flat bed without using bedrails?: A Little Help needed moving from lying on your back to sitting on the side of a flat bed without using bedrails?: A Little Help needed moving to and from a bed to a chair (including a wheelchair)?: A Lot Help needed standing up from a chair using your arms (e.g., wheelchair or bedside chair)?: A Lot Help needed to walk in hospital room?: Total Help needed climbing 3-5 steps with a railing? : Total 6 Click Score: 12    End of Session Equipment Utilized During Treatment: Gait belt Activity Tolerance: Patient tolerated treatment well;Patient  limited by fatigue (dizzy with standing) Patient left: in chair;with call bell/phone within reach;with chair alarm set;with nursing/sitter in room Nurse Communication: Mobility status PT Visit Diagnosis: Unsteadiness on feet (R26.81);Repeated falls (R29.6);Muscle weakness (generalized) (M62.81)     Time: 8988-8964 PT Time Calculation (min) (ACUTE ONLY): 24 min  Charges:    $Gait Training: 8-22 mins $Therapeutic Exercise: 8-22 mins PT General Charges $$ ACUTE PT VISIT: 1 Visit                     Izetta Call, PT, DPT   Acute Rehabilitation Department Office [phone] Secure Chat Communication Preferred   Izetta JULIANNA Call 11/22/2024, 12:42 PM

## 2024-11-22 NOTE — Progress Notes (Addendum)
 "    Advanced Heart Failure Rounding Note  Cardiologist: None   Chief Complaint: Bradycardia  Patient Profile   Shannon Blevins is a 78 y.o. female with history of HTN, hyperlipidemia, iron deficiency anemia, hypothyroidism. No known cardiac history per patient (but history limited). Presented after a fall and subsequently admitted for symptomatic bradycardia and acute renal failure.   Significant events:   12/18 - Bradycardia with 2:1 AV block, s/p TVP  Subjective:    V paced 80, underlying sinus brady 30s  Scr down to 3.3. Good UOP last 24 hrs.  Mentation improving.   Objective:   Weight Range: 73.5 kg Body mass index is 29.16 kg/m.   Vital Signs:   Temp:  [97.1 F (36.2 C)-98.9 F (37.2 C)] 97.1 F (36.2 C) (12/23 1100) Pulse Rate:  [67-80] 79 (12/23 1600) Resp:  [10-18] 17 (12/23 1600) BP: (106-146)/(51-120) 139/73 (12/23 1600) SpO2:  [95 %-99 %] 96 % (12/23 1600) Weight:  [73.5 kg] 73.5 kg (12/23 0500) Last BM Date : 11/18/24  Weight change: Filed Weights   11/20/24 0500 11/21/24 0500 11/22/24 0500  Weight: 66.3 kg 71.6 kg 73.5 kg    Intake/Output:   Intake/Output Summary (Last 24 hours) at 11/22/2024 1654 Last data filed at 11/22/2024 1600 Gross per 24 hour  Intake 2357.33 ml  Output 2705 ml  Net -347.67 ml     Physical Exam   General:  Sitting up in chair. HEENT: L eye ecchymosis Neck: R internal jugular TVP Cor: Regular rate & rhythm. No murmurs. Lungs: clear Abdomen: soft, nontender, nondistended. Extremities: no edema Neuro: alert & orientedx3. Affect pleasant      Telemetry   V-paced 80, underlying sinus brady in the 30s  Labs   CBC Recent Labs    11/21/24 0430 11/22/24 0424  WBC 6.1 6.5  HGB 7.1* 8.4*  HCT 22.4* 25.7*  MCV 94.9 93.8  PLT 169 173   Basic Metabolic Panel Recent Labs    87/77/74 0430 11/22/24 0410 11/22/24 0424  NA 142 140  --   K 3.9 3.6  --   CL 111 108  --   CO2 22 22  --   GLUCOSE 104*  101*  --   BUN 76* 55*  --   CREATININE 4.24* 3.34*  --   CALCIUM  8.1* 8.5*  --   MG 1.7  --  2.5*  PHOS 3.5 3.5  --    Liver Function Tests Recent Labs    11/21/24 0430 11/22/24 0410  ALBUMIN 2.7* 2.8*   No results for input(s): LIPASE, AMYLASE in the last 72 hours. Cardiac Enzymes No results for input(s): CKTOTAL, CKMB, CKMBINDEX, TROPONINI in the last 72 hours.  BNP: BNP (last 3 results) No results for input(s): BNP in the last 8760 hours.  ProBNP (last 3 results) Recent Labs    11/17/24 2032  PROBNP 2,351.0*     D-Dimer No results for input(s): DDIMER in the last 72 hours. Hemoglobin A1C No results for input(s): HGBA1C in the last 72 hours. Fasting Lipid Panel No results for input(s): CHOL, HDL, LDLCALC, TRIG, CHOLHDL, LDLDIRECT in the last 72 hours. Medications:   Scheduled Medications:  atorvastatin   10 mg Oral QPM   brexpiprazole   0.5 mg Oral Daily   Chlorhexidine  Gluconate Cloth  6 each Topical Daily   ferrous sulfate   325 mg Oral Q breakfast   heparin   5,000 Units Subcutaneous Q8H   levothyroxine   137 mcg Oral QAC breakfast   PARoxetine   20 mg Oral Daily   thiamine   100 mg Oral Daily    Infusions:  cefTRIAXone  (ROCEPHIN )  IV Stopped (11/21/24 2147)    PRN Medications: acetaminophen , docusate sodium , ondansetron  (ZOFRAN ) IV, mouth rinse, polyethylene glycol  Assessment/Plan  1. Bradycardia: 2:1 AVB in setting of AKI with atenolol and diltiazem use. Also sinus node dysfunction. No chest pain.  Echo shows preserved LV EF 55-60%, mild RV dilation with normal RV function. TSH was normal.  - Off nodal blockade.  - Continues with underlying sinus bradycardia in 30s.  - EP following, may need PPM 2. AKI: Scr in 7/24 was 1.2. Creatinine 3 afe weeks ago, up to 10.5 with BUN 124 on admission.  BC Powder use may play a role.  Also with bradycardia, nausea, vomiting and diarrhea.  All these could have triggered a prerenal insult  proceeding to ATN.  She additionally had a metabolic acidosis though K is normal.  - Scr improving after IVF resuscitation - Scr down to 3.3 - Off telmisartan/hydrochlorothiazide.  - No acute need for HD 3. Hypothyroidism: Continue Levoxyl , TSH is normal.  4. Hypokalemia/hypomagnesemia: -K 2.5 and Mag 1.2 on presentation  - Supp as needed 5. Atrial fibrillation: Noted to have some slow atrial fibrillation on telemetry that seems to have been transient.   - if AF recurs may need AC - no recurrence  6. E. Coli UTI - Treating with ceftriaxone   CRITICAL CARE Performed by: COLLETTA SHAVER N   Total critical care time: 12 minutes  Critical care time was exclusive of separately billable procedures and treating other patients.  Critical care was necessary to treat or prevent imminent or life-threatening deterioration.  Critical care was time spent personally by me on the following activities: development of treatment plan with patient and/or surrogate as well as nursing, discussions with consultants, evaluation of patient's response to treatment, examination of patient, obtaining history from patient or surrogate, ordering and performing treatments and interventions, ordering and review of laboratory studies, ordering and review of radiographic studies, pulse oximetry and re-evaluation of patient's condition.    Length of Stay: 5  FINCH, LINDSAY N, PA-C  11/22/2024, 4:54 PM  Advanced Heart Failure Team Pager [phone] (M-F; 7a - 5p)   Please visit [email or web address]: For overnight coverage please call cardiology fellow first. If fellow not available call Shock/ECMO MD on call.  For ECMO / Mechanical Support (Impella, IABP, LVAD) issues call Shock / ECMO MD on call.    Agree with above.   Feeling better, Scr improving.   Remains v-paced through TVP. Sinus brady 30-40s underneath.   TVP threshold increasing now at 4mA  General:  Sitting up in bed. No resp difficulty HEENT: normal + left  eye ecchymoses Neck: supple. no JVD.  Cor: Regular rate & rhythm. No rubs, gallops or murmurs. Lungs: clear Abdomen: soft, nontender, nondistended.Good bowel sounds. Extremities: no cyanosis, clubbing, rash, edema Neuro: alert & orientedx3, cranial nerves grossly intact. moves all 4 extremities w/o difficulty. Affect pleasant  She remains with sinus node dysfunction with HRs predominantly in 30-40s. Threshold on TVP increasing.   She will need PPM.   EP following but want to defer until other issues improved. However situation confounded by increasing thresholds on TVP.   I d/w EP personally.   CRITICAL CARE Performed by: Cherrie Sieving  Total critical care time: 32 minutes  Critical care time was exclusive of separately billable procedures and treating other patients.  Critical care was necessary to treat or prevent imminent or  life-threatening deterioration.  Critical care was time spent personally by me (independent of midlevel providers or residents) on the following activities: development of treatment plan with patient and/or surrogate as well as nursing, discussions with consultants, evaluation of patient's response to treatment, examination of patient, obtaining history from patient or surrogate, ordering and performing treatments and interventions, ordering and review of laboratory studies, ordering and review of radiographic studies, pulse oximetry and re-evaluation of patient's condition.  Toribio Fuel, MD  11:50 PM   "

## 2024-11-22 NOTE — Progress Notes (Signed)
 eLink Physician-Brief Progress Note Patient Name: Shannon Blevins DOB: [birth year] MRN: [id number]   Date of Service  11/22/2024  HPI/Events of Note  K 3.6, Cr 3.34  eICU Interventions  Kcl     Intervention Category Minor Interventions: Electrolytes abnormality - evaluation and management  Amaad Byers 11/22/2024, 5:52 AM

## 2024-11-22 NOTE — Plan of Care (Signed)
  Problem: Education: Goal: Knowledge of General Education information will improve Description: Including pain rating scale, medication(s)/side effects and non-pharmacologic comfort measures Outcome: Progressing   Problem: Health Behavior/Discharge Planning: Goal: Ability to manage health-related needs will improve Outcome: Progressing   Problem: Clinical Measurements: Goal: Will remain free from infection Outcome: Progressing Goal: Diagnostic test results will improve Outcome: Progressing Goal: Respiratory complications will improve Outcome: Progressing Goal: Cardiovascular complication will be avoided Outcome: Progressing   Problem: Activity: Goal: Risk for activity intolerance will decrease Outcome: Progressing   Problem: Nutrition: Goal: Adequate nutrition will be maintained Outcome: Progressing   Problem: Coping: Goal: Level of anxiety will decrease Outcome: Progressing

## 2024-11-23 ENCOUNTER — Encounter (HOSPITAL_COMMUNITY): Payer: Self-pay | Admitting: Internal Medicine

## 2024-11-23 DIAGNOSIS — N179 Acute kidney failure, unspecified: Secondary | ICD-10-CM | POA: Diagnosis not present

## 2024-11-23 DIAGNOSIS — I459 Conduction disorder, unspecified: Secondary | ICD-10-CM | POA: Diagnosis not present

## 2024-11-23 DIAGNOSIS — I441 Atrioventricular block, second degree: Secondary | ICD-10-CM | POA: Diagnosis not present

## 2024-11-23 LAB — CBC
HCT: 26.8 % — ABNORMAL LOW (ref 36.0–46.0)
Hemoglobin: 8.9 g/dL — ABNORMAL LOW (ref 12.0–15.0)
MCH: 30 pg (ref 26.0–34.0)
MCHC: 33.2 g/dL (ref 30.0–36.0)
MCV: 90.2 fL (ref 80.0–100.0)
Platelets: 175 K/uL (ref 150–400)
RBC: 2.97 MIL/uL — ABNORMAL LOW (ref 3.87–5.11)
RDW: 13.3 % (ref 11.5–15.5)
WBC: 6.6 K/uL (ref 4.0–10.5)
nRBC: 0 % (ref 0.0–0.2)

## 2024-11-23 LAB — RENAL FUNCTION PANEL
Albumin: 2.9 g/dL — ABNORMAL LOW (ref 3.5–5.0)
Anion gap: 9 (ref 5–15)
BUN: 46 mg/dL — ABNORMAL HIGH (ref 8–23)
CO2: 22 mmol/L (ref 22–32)
Calcium: 8.8 mg/dL — ABNORMAL LOW (ref 8.9–10.3)
Chloride: 110 mmol/L (ref 98–111)
Creatinine, Ser: 2.83 mg/dL — ABNORMAL HIGH (ref 0.44–1.00)
GFR, Estimated: 17 mL/min — ABNORMAL LOW
Glucose, Bld: 102 mg/dL — ABNORMAL HIGH (ref 70–99)
Phosphorus: 2.9 mg/dL (ref 2.5–4.6)
Potassium: 4.3 mmol/L (ref 3.5–5.1)
Sodium: 141 mmol/L (ref 135–145)

## 2024-11-23 LAB — MAGNESIUM: Magnesium: 1.7 mg/dL (ref 1.7–2.4)

## 2024-11-23 MED ORDER — MAGNESIUM SULFATE 2 GM/50ML IV SOLN
2.0000 g | Freq: Once | INTRAVENOUS | Status: AC
  Administered 2024-11-23: 2 g via INTRAVENOUS
  Filled 2024-11-23: qty 50

## 2024-11-23 NOTE — Plan of Care (Signed)

## 2024-11-23 NOTE — Progress Notes (Signed)
"  °  Patient Name: Shannon Blevins Date of Encounter: 11/23/2024  Primary Cardiologist: None Electrophysiologist: None  Interval Summary   NAEON Per nursing, mental status continues to improve daily  She has no complaints this AM, denies chest pain, chest pressure, or palpitations. Neck and facial pain are improving  Vital Signs    Vitals:   11/23/24 0400 11/23/24 0500 11/23/24 0600 11/23/24 0700  BP: 125/70 100/69 126/76 126/84  Pulse: 81 79 78 78  Resp: 12 12 12 17   Temp:    98.3 F (36.8 C)  TempSrc:    Oral  SpO2: 94% 97% 95% 97%  Weight:      Height:        Intake/Output Summary (Last 24 hours) at 11/23/2024 0837 Last data filed at 11/23/2024 0544 Gross per 24 hour  Intake 772.04 ml  Output 1885 ml  Net -1112.96 ml   Filed Weights   11/21/24 0500 11/22/24 0500 11/23/24 0108  Weight: 71.6 kg 73.5 kg 70.4 kg    Physical Exam    GEN- NAD, Alert and oriented, sitting in chair Lungs- Clear to ausculation bilaterally, normal work of breathing Cardiac- Regular rate and rhythm, no murmurs, rubs or gallops GI- soft, NT, ND, + BS Extremities- no clubbing or cyanosis. No edema  Telemetry    VP at 24 (personally reviewed)  Hospital Course    Shannon Blevins is a 78 y.o. female with PMH of HTN, HLD, IDA, hypothyroid, CKD admitted for fall at home, found to be profoundly bradycardic with Cr of 11/BUN 130  Assessment & Plan    #) SND vs junctional bradycardia  #) TVP in place #) AKI, improving Bradycardic this AM vs CHB Remains VP at 70 Threshold 6mv Given mental status improvement, Cr improvement, consider PPM implant today She ate breakfast this AM, clear liquid now  MD to see  #) anemia #) UTI Per primary      For questions or updates, please contact [facility] Please consult [email or web address] for contact info under     Signed, Terriana Barreras, NP  11/23/2024, 8:37 AM   "

## 2024-11-23 NOTE — Progress Notes (Addendum)
 "    Advanced Heart Failure Rounding Note  Cardiologist: None  Chief Complaint: Bradycardia Patient Profile   Shannon Blevins is a 78 y.o. female with history of HTN, hyperlipidemia, iron deficiency anemia, hypothyroidism. No known cardiac history per patient (but history limited). Presented after a fall and subsequently admitted for symptomatic bradycardia and acute renal failure.   Significant events:   12/18 - Bradycardia with 2:1 AV block, s/p TVP  Subjective:    Continue to requiring pacing. VP 70. EP considering PPM placement today.  Net negative 1.2L. Weight down 7 lbs. sCr continues to improve 3.34 >2.83  Sitting up in chair. No SOB or dizziness. Oriented.   Objective:    Weight Range: 70.4 kg Body mass index is 28.39 kg/m.   Vital Signs:   Temp:  [97.1 F (36.2 C)-98.4 F (36.9 C)] 98.3 F (36.8 C) (12/24 0700) Pulse Rate:  [72-81] 81 (12/24 0800) Resp:  [11-17] 12 (12/24 0800) BP: (100-145)/(54-118) 118/66 (12/24 0800) SpO2:  [93 %-99 %] 98 % (12/24 0800) Weight:  [70.4 kg] 70.4 kg (12/24 0108) Last BM Date : 11/23/24  Weight change: Filed Weights   11/21/24 0500 11/22/24 0500 11/23/24 0108  Weight: 71.6 kg 73.5 kg 70.4 kg   Intake/Output:  Intake/Output Summary (Last 24 hours) at 11/23/2024 0933 Last data filed at 11/23/2024 0800 Gross per 24 hour  Intake 937.14 ml  Output 2035 ml  Net -1097.86 ml    Physical Exam   General: Elderly appearing. No distress  Cardiac: JVP flat. No murmurs. TVP RIJ Extremities: Warm and dry.  No edema.  Neuro:  Affect pleasant.   Telemetry   VP 70s (personally reviewed)  Labs   CBC Recent Labs    11/22/24 0424 11/23/24 0312  WBC 6.5 6.6  HGB 8.4* 8.9*  HCT 25.7* 26.8*  MCV 93.8 90.2  PLT 173 175   Basic Metabolic Panel Recent Labs    87/76/74 0410 11/22/24 0424 11/23/24 0312  NA 140  --  141  K 3.6  --  4.3  CL 108  --  110  CO2 22  --  22  GLUCOSE 101*  --  102*  BUN 55*  --  46*   CREATININE 3.34*  --  2.83*  CALCIUM  8.5*  --  8.8*  MG  --  2.5* 1.7  PHOS 3.5  --  2.9   Liver Function Tests Recent Labs    11/22/24 0410 11/23/24 0312  ALBUMIN 2.8* 2.9*   ProBNP (last 3 results) Recent Labs    11/17/24 2032  PROBNP 2,351.0*   Medications:    Scheduled Medications:  atorvastatin   10 mg Oral QPM   brexpiprazole   0.5 mg Oral Daily   Chlorhexidine  Gluconate Cloth  6 each Topical Daily   ferrous sulfate   325 mg Oral Q breakfast   heparin   5,000 Units Subcutaneous Q8H   levothyroxine   137 mcg Oral QAC breakfast   PARoxetine   20 mg Oral Daily   thiamine   100 mg Oral Daily    Infusions:  cefTRIAXone  (ROCEPHIN )  IV Stopped (11/22/24 2204)   magnesium  sulfate bolus IVPB 2 g (11/23/24 0921)    PRN Medications: acetaminophen , docusate sodium , ondansetron  (ZOFRAN ) IV, mouth rinse, polyethylene glycol  Assessment/Plan   1. Bradycardia: 2:1 AVB in setting of AKI with atenolol and diltiazem use. Also sinus node dysfunction. No chest pain.  Echo shows preserved LV EF 55-60%, mild RV dilation with normal RV function. TSH was normal.  - Off  nodal blockade.  - Continues with underlying sinus bradycardia in 30s.  - EP following, considering PPM placement today  2. AKI: Scr in 7/24 was 1.2. Creatinine 3 afe weeks ago, up to 10.5 with BUN 124 on admission.  BC Powder use may play a role.  Also with bradycardia, nausea, vomiting and diarrhea.  All these could have triggered a prerenal insult proceeding to ATN.  She additionally had a metabolic acidosis though K is normal.  - Scr down to 2.8 - Off telmisartan/hydrochlorothiazide.  - No acute need for HD  3. Hypothyroidism: Continue Levoxyl , TSH is normal.   4. Hypokalemia/hypomagnesemia: - K 2.5 and Mag 1.2 on presentation  - Supp as needed  5. Atrial fibrillation: Noted to have some slow atrial fibrillation on telemetry that seems to have been transient.   - if AF recurs may need AC - no recurrence   6. E.  Coli UTI - Treating with ceftriaxone   CRITICAL CARE Performed by: Jordan Lee  Total critical care time: 7 minutes  -Critical care time was exclusive of separately billable procedures and treating other patients. -Critical care was necessary to treat or prevent imminent or life-threatening deterioration. -Critical care was time spent personally by me on the following activities: development of treatment plan with patient and/or surrogate as well as nursing, discussions with consultants, evaluation of patient's response to treatment, examination of patient, obtaining history from patient or surrogate, ordering and performing treatments and interventions, ordering and review of laboratory studies, ordering and review of radiographic studies, pulse oximetry and re-evaluation of patient's condition.   Length of Stay: 6  Jordan Lee, NP  11/23/2024, 9:33 AM  Advanced Heart Failure Team Pager [phone] (M-F; 7a - 5p)   Please visit [email or web address]: For overnight coverage please call cardiology fellow first. If fellow not available call Shock/ECMO MD on call.  For ECMO / Mechanical Support (Impella, IABP, LVAD) issues call Shock / ECMO MD on call.    Agree with above  Feels ok. Renal function improving  Still with TVP at 70. Underneath is sinus brady in 30s. Pacer threshold now up to 5.0 mA  General:  Sitting up in bed. No resp difficulty HEENT: normal  + l eye ecchymosis Neck: supple. no JVD.  RIJ TVP Cor: Regular rate & rhythm. No rubs, gallops or murmurs. Lungs: clear Abdomen: soft, nontender, nondistended.Good bowel sounds. Extremities: no cyanosis, clubbing, rash, edema Neuro: alert & orientedx3, cranial nerves grossly intact. moves all 4 extremities w/o difficulty. Affect pleasant  Continues to require pacing in setting of severe sinus node dysfunction.   D/w timing of PPM with EP again today. They have decided to defer today.   CRITICAL CARE Performed by: Cherrie Sieving  Total  critical care time: 31 minutes  Critical care time was exclusive of separately billable procedures and treating other patients.  Critical care was necessary to treat or prevent imminent or life-threatening deterioration.  Critical care was time spent personally by me (independent of midlevel providers or residents) on the following activities: development of treatment plan with patient and/or surrogate as well as nursing, discussions with consultants, evaluation of patient's response to treatment, examination of patient, obtaining history from patient or surrogate, ordering and performing treatments and interventions, ordering and review of laboratory studies, ordering and review of radiographic studies, pulse oximetry and re-evaluation of patient's condition.  Sieving Cherrie, MD  6:14 PM    "

## 2024-11-23 NOTE — Progress Notes (Signed)
 "  NAME:  Shannon Blevins, MRN:  [id number], DOB:  July 22, 1946, LOS: 6 ADMISSION DATE:  11/17/2024, CONSULTATION DATE:  12/18 REFERRING MD:  Patsey, EDP CHIEF COMPLAINT:  bradycardia   History of Present Illness:  78 year old female with past medical history of asthma, hypertension, hypothyroidism, depression, iron deficiency anemia, hyperlipidemia who presents to the ER after fall. Daughter called EMS after patient fall at home, striking her face. Noted several days of nausea, vomiting, diarrhea. Daughter also notes more confusion over the past few weeks. In ED found to have heart blood with HR 30s, hypotensive. Was given 1mg  atropine  with no improvement. Given IVF. Cardiology consulted and taking to cath lab for TVP. CCM asked to admit.   Of note I-stat chem 8 w/ BUN >130, sCr 11.5. 2 weeks ago her labs showed BUN 14, sCr 3.05.   Pertinent  Medical History  Hypertension, HLD, hypothyroid, depression, IDA, asthma   Significant Hospital Events: Including procedures, antibiotic start and stop dates in addition to other pertinent events   12/18: admit for CHB, had TVP placed 12/22 IVF KVO. Renal fxn better. MS improved. Still req pacer  12/23 still requires intermittent pacing with rate turndown   Interim History / Subjective:  Remain afebrile, denies any complaint Serum creatinine continued to improve Remain in complete heart block/junctional rhythm with heart rate in 30s  Objective   Blood pressure 118/66, pulse 81, temperature 98.3 F (36.8 C), temperature source Oral, resp. rate 12, height 5' 2 (1.575 m), weight 70.4 kg, SpO2 98%.        Intake/Output Summary (Last 24 hours) at 11/23/2024 0857 Last data filed at 11/23/2024 0800 Gross per 24 hour  Intake 1012.04 ml  Output 2035 ml  Net -1022.96 ml   Filed Weights   11/21/24 0500 11/22/24 0500 11/23/24 0108  Weight: 71.6 kg 73.5 kg 70.4 kg    Physical exam: General: Early female, sitting on recliner HEENT: Bruise mark  noted around left eyeball, eyes anicteric.  moist mucus membranes Neuro: Alert, awake following commands Chest: Coarse breath sounds, no wheezes or rhonchi Heart: Paced rhythm at 80 bpm, no murmurs or gallops Abdomen: Soft, nontender, nondistended, bowel sounds present  Labs reviewed  Resolved Hospital Problem list   Hypovolemic shock Assessment & Plan:  Sinus node dysfunction High degree AV block/junctional rhythm AKI due to cardiorenal syndrome Acute metabolic/uremic encephalopathy, clearing Acute urinary tract infection with E. coli Hypokalemia, corrected Hypomagnesemia Hypothyroidism Hyperlipidemia Iron deficiency anemia  Patient continued to require pacing, underlying heart rate is in 30s EP cardiology is following, plan for possible PPM today Continue telemetry monitoring Serum creatinine continue to improve, she is making good amount of urine Monitor intake and output Avoid nephrotoxic agent Mental status significantly improved Continue IV ceftriaxone  to complete 5 days therapy, today is the last day Continue aggressive electrolyte replacement Continue levothyroxine  Continue atorvastatin  Continue ferrous sulfate    The patient is critically ill due to complete heart block requiring close monitoring also on TVP requiring pacing .  Critical care was necessary to treat or prevent imminent or life-threatening deterioration.  Critical care was time spent personally by me on the following activities: development of treatment plan with patient and/or surrogate as well as nursing, discussions with consultants, evaluation of patient's response to treatment, examination of patient, obtaining history from patient or surrogate, ordering and performing treatments and interventions, ordering and review of laboratory studies, ordering and review of radiographic studies, pulse oximetry, re-evaluation of patient's condition and participation in multidisciplinary rounds.  During this  encounter critical care time was devoted to patient care services described in this note for 30 minutes.     Valinda Novas, MD [facility] See Amion for pager If no response to pager, please call [phone] until 7pm After 7pm, Please call E-link [phone]  "

## 2024-11-24 DIAGNOSIS — I459 Conduction disorder, unspecified: Secondary | ICD-10-CM | POA: Diagnosis not present

## 2024-11-24 DIAGNOSIS — N179 Acute kidney failure, unspecified: Secondary | ICD-10-CM | POA: Diagnosis not present

## 2024-11-24 DIAGNOSIS — I441 Atrioventricular block, second degree: Secondary | ICD-10-CM | POA: Diagnosis not present

## 2024-11-24 LAB — RENAL FUNCTION PANEL
Albumin: 3 g/dL — ABNORMAL LOW (ref 3.5–5.0)
Anion gap: 10 (ref 5–15)
BUN: 39 mg/dL — ABNORMAL HIGH (ref 8–23)
CO2: 23 mmol/L (ref 22–32)
Calcium: 9.1 mg/dL (ref 8.9–10.3)
Chloride: 106 mmol/L (ref 98–111)
Creatinine, Ser: 2.58 mg/dL — ABNORMAL HIGH (ref 0.44–1.00)
GFR, Estimated: 19 mL/min — ABNORMAL LOW
Glucose, Bld: 103 mg/dL — ABNORMAL HIGH (ref 70–99)
Phosphorus: 3.2 mg/dL (ref 2.5–4.6)
Potassium: 4.1 mmol/L (ref 3.5–5.1)
Sodium: 139 mmol/L (ref 135–145)

## 2024-11-24 LAB — MAGNESIUM: Magnesium: 2.1 mg/dL (ref 1.7–2.4)

## 2024-11-24 NOTE — Plan of Care (Signed)

## 2024-11-24 NOTE — Progress Notes (Signed)
 "    Advanced Heart Failure Rounding Note  Cardiologist: None  Chief Complaint: Bradycardia Patient Profile   Shannon Blevins is a 78 y.o. female with history of HTN, hyperlipidemia, iron deficiency anemia, hypothyroidism. No known cardiac history per patient (but history limited). Presented after a fall and subsequently admitted for symptomatic bradycardia and acute renal failure.   Significant events:   12/18 - Bradycardia with 2:1 AV block, s/p TVP  Subjective:    Continue to requiring pacing. VP 70.  sCr continues to improve 3.34 >2.83 > 2.5  Sitting up in bed. Feels good. Has been treated for UTI    Objective:    Weight Range: 73.8 kg Body mass index is 29.76 kg/m.   Vital Signs:   Temp:  [97.6 F (36.4 C)-98.4 F (36.9 C)] 98.4 F (36.9 C) (12/25 0756) Pulse Rate:  [53-71] 66 (12/25 0800) Resp:  [8-20] 11 (12/25 0800) BP: (94-150)/(63-86) 116/66 (12/25 0800) SpO2:  [93 %-99 %] 94 % (12/25 0800) Weight:  [73.8 kg] 73.8 kg (12/25 0500) Last BM Date : 11/23/24  Weight change: Filed Weights   11/22/24 0500 11/23/24 0108 11/24/24 0500  Weight: 73.5 kg 70.4 kg 73.8 kg   Intake/Output:  Intake/Output Summary (Last 24 hours) at 11/24/2024 0913 Last data filed at 11/24/2024 0800 Gross per 24 hour  Intake 270 ml  Output 1810 ml  Net -1540 ml    Physical Exam   General:  Sitting up in bed. No resp difficulty HEENT: normal Neck: supple. RIJ TVP Cor: Regular rate & rhythm. No rubs, gallops or murmurs. Lungs: clear Abdomen: soft, nontender, nondistended.Good bowel sounds. Extremities: no cyanosis, clubbing, rash, edema Neuro: alert & orientedx3, cranial nerves grossly intact. moves all 4 extremities w/o difficulty. Affect pleasant   Telemetry   VP 70s underlying sinus brady 40-45 (personally reviewed) Pacing threshold 6.0 mA Personally reviewed  Labs   CBC Recent Labs    11/22/24 0424 11/23/24 0312  WBC 6.5 6.6  HGB 8.4* 8.9*  HCT 25.7* 26.8*   MCV 93.8 90.2  PLT 173 175   Basic Metabolic Panel Recent Labs    87/75/74 0312 11/24/24 0215  NA 141 139  K 4.3 4.1  CL 110 106  CO2 22 23  GLUCOSE 102* 103*  BUN 46* 39*  CREATININE 2.83* 2.58*  CALCIUM  8.8* 9.1  MG 1.7 2.1  PHOS 2.9 3.2   Liver Function Tests Recent Labs    11/23/24 0312 11/24/24 0215  ALBUMIN 2.9* 3.0*   ProBNP (last 3 results) Recent Labs    11/17/24 2032  PROBNP 2,351.0*   Medications:    Scheduled Medications:  atorvastatin   10 mg Oral QPM   brexpiprazole   0.5 mg Oral Daily   Chlorhexidine  Gluconate Cloth  6 each Topical Daily   ferrous sulfate   325 mg Oral Q breakfast   heparin   5,000 Units Subcutaneous Q8H   levothyroxine   137 mcg Oral QAC breakfast   PARoxetine   20 mg Oral Daily   thiamine   100 mg Oral Daily    Infusions:    PRN Medications: acetaminophen , docusate sodium , ondansetron  (ZOFRAN ) IV, mouth rinse, polyethylene glycol  Assessment/Plan   1. Bradycardia: 2:1 AVB in setting of AKI with atenolol and diltiazem use. Also sinus node dysfunction. No chest pain.  Echo shows preserved LV EF 55-60%, mild RV dilation with normal RV function. TSH was normal.  - Off nodal blockade.  - Continues with underlying sinus bradycardia in 40s.  - Thresholds increasing on TVP -  Hopefully can get PPM tomorrow   2. AKI: Scr in 7/24 was 1.2. Creatinine 3 afe weeks ago, up to 10.5 with BUN 124 on admission.  BC Powder use may play a role.  Also with bradycardia, nausea, vomiting and diarrhea.  All these could have triggered a prerenal insult proceeding to ATN.  She additionally had a metabolic acidosis though K is normal.  - Scr down to 2.8 to 2.58 - Off telmisartan/hydrochlorothiazide.  - No acute need for HD  3. Hypothyroidism: Continue Levoxyl , TSH is normal.   4. Hypokalemia/hypomagnesemia: - K 2.5 and Mag 1.2 on presentation  - K 4.1 today - Supp as needed  5. Atrial fibrillation: Noted to have some slow atrial fibrillation on  telemetry that seems to have been transient.   - if AF recurs may need AC - no recurrence   6. E. Coli UTI - Treating with ceftriaxone  - Pull Foley today  CRITICAL CARE Performed by: Toribio Fuel  Total critical care time:32 minutes  -Critical care time was exclusive of separately billable procedures and treating other patients. -Critical care was necessary to treat or prevent imminent or life-threatening deterioration. -Critical care was time spent personally by me on the following activities: development of treatment plan with patient and/or surrogate as well as nursing, discussions with consultants, evaluation of patient's response to treatment, examination of patient, obtaining history from patient or surrogate, ordering and performing treatments and interventions, ordering and review of laboratory studies, ordering and review of radiographic studies, pulse oximetry and re-evaluation of patient's condition.   Length of Stay: 7  Toribio Fuel, MD  11/24/2024, 9:13 AM  Advanced Heart Failure Team Pager [phone] (M-F; 7a - 5p)   Please visit [email or web address]: For overnight coverage please call cardiology fellow first. If fellow not available call Shock/ECMO MD on call.  For ECMO / Mechanical Support (Impella, IABP, LVAD) issues call Shock / ECMO MD on call.      "

## 2024-11-24 NOTE — Progress Notes (Signed)
" ° °  NAME:  Shannon Blevins, MRN:  [id number], DOB:  05-06-1946, LOS: 7 ADMISSION DATE:  11/17/2024, CONSULTATION DATE:  12/18 REFERRING MD:  Patsey, EDP CHIEF COMPLAINT:  bradycardia   History of Present Illness:  78 year old female with past medical history of asthma, hypertension, hypothyroidism, depression, iron deficiency anemia, hyperlipidemia who presents to the ER after fall. Daughter called EMS after patient fall at home, striking her face. Noted several days of nausea, vomiting, diarrhea. Daughter also notes more confusion over the past few weeks. In ED found to have heart blood with HR 30s, hypotensive. Was given 1mg  atropine  with no improvement. Given IVF. Cardiology consulted and taking to cath lab for TVP. CCM asked to admit.   Of note I-stat chem 8 w/ BUN >130, sCr 11.5. 2 weeks ago her labs showed BUN 14, sCr 3.05.   Pertinent  Medical History  Hypertension, HLD, hypothyroid, depression, IDA, asthma   Significant Hospital Events: Including procedures, antibiotic start and stop dates in addition to other pertinent events   12/18: admit for CHB, had TVP placed 12/22 IVF KVO. Renal fxn better. MS improved. Still req pacer  12/23 still requires intermittent pacing with rate turndown  12/24 continued require pacing, stated feeling better, serum creatinine continued to trend down  Interim History / Subjective:  No overnight issues Remains pacer dependent, underlying rhythm is junctional rhythm with heart rate in low 40s   Objective   Blood pressure 116/66, pulse 66, temperature 98.4 F (36.9 C), temperature source Oral, resp. rate 11, height 5' 2 (1.575 m), weight 73.8 kg, SpO2 94%.        Intake/Output Summary (Last 24 hours) at 11/24/2024 9092 Last data filed at 11/24/2024 0800 Gross per 24 hour  Intake 270 ml  Output 1810 ml  Net -1540 ml   Filed Weights   11/22/24 0500 11/23/24 0108 11/24/24 0500  Weight: 73.5 kg 70.4 kg 73.8 kg    Physical exam: General:  Elderly female, lying on the bed HEENT: Eyes anicteric.  moist mucus membranes.  She has bruise mark around her right eyeball Neuro: Alert, awake following commands Chest: Coarse breath sounds, no wheezes or rhonchi Heart: Paced rhythm at 70 bpm, no murmurs or gallops Abdomen: Soft, nontender, nondistended, bowel sounds present  Labs reviewed  Resolved Hospital Problem list   Hypovolemic shock Hypokalemia Acute metabolic/uremic encephalopathy Hypomagnesemia Assessment & Plan:  Sinus node dysfunction High degree AV block/junctional rhythm AKI due to cardiorenal syndrome, continue to improve Acute urinary tract infection with E. coli, completed treatment Hypothyroidism Hyperlipidemia Iron deficiency anemia  Patient continued to require pacing, underlying heart rate is in 40s Initial plan was to place PPM yesterday but it was deferred Looks like patient will need PPM tomorrow Continue telemetry monitoring Serum creatinine continue to improve Urine output was 1.8 L yesterday with net -1.3 Monitor intake and output She completed 5-day course of ceftriaxone  for E. coli UTI Closely monitor and supplement electrolytes Continue levothyroxine  Continue atorvastatin  and ferrous sulfate     Valinda Novas, MD [facility] See Amion for pager If no response to pager, please call [phone] until 7pm After 7pm, Please call E-link [phone]  "

## 2024-11-25 ENCOUNTER — Inpatient Hospital Stay (HOSPITAL_COMMUNITY)
Admission: EM | Disposition: A | Discharge status: Home Health | Payer: Self-pay | Source: Home / Self Care | Attending: Internal Medicine

## 2024-11-25 DIAGNOSIS — N179 Acute kidney failure, unspecified: Secondary | ICD-10-CM

## 2024-11-25 DIAGNOSIS — D509 Iron deficiency anemia, unspecified: Secondary | ICD-10-CM | POA: Diagnosis not present

## 2024-11-25 DIAGNOSIS — E039 Hypothyroidism, unspecified: Secondary | ICD-10-CM | POA: Diagnosis not present

## 2024-11-25 DIAGNOSIS — R5381 Other malaise: Secondary | ICD-10-CM

## 2024-11-25 DIAGNOSIS — I495 Sick sinus syndrome: Secondary | ICD-10-CM

## 2024-11-25 DIAGNOSIS — I459 Conduction disorder, unspecified: Secondary | ICD-10-CM | POA: Diagnosis not present

## 2024-11-25 DIAGNOSIS — E785 Hyperlipidemia, unspecified: Secondary | ICD-10-CM

## 2024-11-25 DIAGNOSIS — N390 Urinary tract infection, site not specified: Secondary | ICD-10-CM

## 2024-11-25 DIAGNOSIS — I441 Atrioventricular block, second degree: Secondary | ICD-10-CM | POA: Diagnosis not present

## 2024-11-25 DIAGNOSIS — B9620 Unspecified Escherichia coli [E. coli] as the cause of diseases classified elsewhere: Secondary | ICD-10-CM

## 2024-11-25 DIAGNOSIS — I4891 Unspecified atrial fibrillation: Secondary | ICD-10-CM | POA: Diagnosis not present

## 2024-11-25 HISTORY — PX: PACEMAKER IMPLANT: EP1218

## 2024-11-25 LAB — RENAL FUNCTION PANEL
Albumin: 3 g/dL — ABNORMAL LOW (ref 3.5–5.0)
Anion gap: 9 (ref 5–15)
BUN: 33 mg/dL — ABNORMAL HIGH (ref 8–23)
CO2: 24 mmol/L (ref 22–32)
Calcium: 9.1 mg/dL (ref 8.9–10.3)
Chloride: 107 mmol/L (ref 98–111)
Creatinine, Ser: 2.51 mg/dL — ABNORMAL HIGH (ref 0.44–1.00)
GFR, Estimated: 19 mL/min — ABNORMAL LOW
Glucose, Bld: 97 mg/dL (ref 70–99)
Phosphorus: 3 mg/dL (ref 2.5–4.6)
Potassium: 4.3 mmol/L (ref 3.5–5.1)
Sodium: 140 mmol/L (ref 135–145)

## 2024-11-25 LAB — MAGNESIUM: Magnesium: 1.7 mg/dL (ref 1.7–2.4)

## 2024-11-25 MED ORDER — LIDOCAINE HCL (PF) 1 % IJ SOLN
INTRAMUSCULAR | Status: DC | PRN
  Administered 2024-11-25: 30 mL

## 2024-11-25 MED ORDER — SODIUM CHLORIDE 0.9 % IV SOLN
INTRAVENOUS | Status: AC
  Administered 2024-11-25: 80 mg
  Filled 2024-11-25: qty 2

## 2024-11-25 MED ORDER — MIDAZOLAM HCL 5 MG/5ML IJ SOLN
INTRAMUSCULAR | Status: DC | PRN
  Administered 2024-11-25 (×2): 1 mg via INTRAVENOUS

## 2024-11-25 MED ORDER — SODIUM CHLORIDE 0.9% FLUSH: 3.0000 mL | INTRAVENOUS | Status: DC | PRN

## 2024-11-25 MED ORDER — SODIUM CHLORIDE 0.9 % IV SOLN: INTRAVENOUS | Status: DC

## 2024-11-25 MED ORDER — HEPARIN (PORCINE) IN NACL 1000-0.9 UT/500ML-% IV SOLN
INTRAVENOUS | Status: DC | PRN
  Administered 2024-11-25: 500 mL

## 2024-11-25 MED ORDER — LIDOCAINE HCL (PF) 1 % IJ SOLN
INTRAMUSCULAR | Status: AC
  Filled 2024-11-25: qty 60

## 2024-11-25 MED ORDER — CHLORHEXIDINE GLUCONATE 4 % EX SOLN: 60.0000 mL | Freq: Once | CUTANEOUS | Status: DC

## 2024-11-25 MED ORDER — MIDAZOLAM HCL 2 MG/2ML IJ SOLN
INTRAMUSCULAR | Status: AC
  Filled 2024-11-25: qty 2

## 2024-11-25 MED ORDER — CEFAZOLIN SODIUM-DEXTROSE 2-4 GM/100ML-% IV SOLN
INTRAVENOUS | Status: AC
  Administered 2024-11-25: 2 g via INTRAVENOUS
  Filled 2024-11-25: qty 100

## 2024-11-25 MED ORDER — CEFAZOLIN SODIUM-DEXTROSE 2-4 GM/100ML-% IV SOLN: 2.0000 g | INTRAVENOUS | Status: AC

## 2024-11-25 MED ORDER — SODIUM CHLORIDE 0.9 % IV SOLN
80.0000 mg | INTRAVENOUS | Status: AC
  Filled 2024-11-25: qty 2

## 2024-11-25 MED ORDER — FENTANYL CITRATE (PF) 100 MCG/2ML IJ SOLN
INTRAMUSCULAR | Status: DC | PRN
  Administered 2024-11-25 (×2): 25 ug via INTRAVENOUS

## 2024-11-25 MED ORDER — SODIUM CHLORIDE 0.9% FLUSH
3.0000 mL | Freq: Two times a day (BID) | INTRAVENOUS | Status: DC
  Administered 2024-11-25: 3 mL via INTRAVENOUS

## 2024-11-25 MED ORDER — FENTANYL CITRATE (PF) 100 MCG/2ML IJ SOLN
INTRAMUSCULAR | Status: AC
  Filled 2024-11-25: qty 2

## 2024-11-25 MED ORDER — MAGNESIUM SULFATE 4 GM/100ML IV SOLN
4.0000 g | Freq: Once | INTRAVENOUS | Status: AC
  Administered 2024-11-25: 4 g via INTRAVENOUS
  Filled 2024-11-25 (×2): qty 100

## 2024-11-25 NOTE — Discharge Instructions (Signed)
 After Your Pacemaker   You have a Medtronic Pacemaker  If you have a Medtronic or Biotronik device, plug in your home monitor once you get home, and no manual interaction is required.   If you have an [employer] or [employer] device, plug your home monitor once you get home, sit near the device, and press the large activation button. Sit nearby until the process is complete, usually notated by lights on the monitor.   If you were set up for monitoring using an app on your phone, make sure the app remains open in the background and the Bluetooth remains on.  ACTIVITY Do not lift your arm above shoulder height for 1 week after your procedure. After 7 days, you may progress as below.  You should remove your sling 24 hours after your procedure, unless otherwise instructed by your provider.     Friday December 02, 2024  Saturday December 03, 2024 Sunday December 04, 2024 Monday December 05, 2024   Do not lift, push, pull, or carry anything over 10 pounds with the affected arm until 6 weeks (Friday January 06, 2025 ) after your procedure.   You may drive AFTER your wound check, unless you have been told otherwise by your provider.   Ask your healthcare provider when you can go back to work   INCISION/Dressing If you are on a blood thinner such as Coumadin, Xarelto, Eliquis , Plavix, or Pradaxa please confirm with your provider when this should be resumed.   If large square, outer bandage is left in place, this can be removed after 24 hours from your procedure. Do not remove steri-strips or glue as below.   If a PRESSURE DRESSING (a bulky dressing that usually goes up over your shoulder) was applied or left in place, please follow instructions given by your provider on when to return to have this removed.   Monitor your Pacemaker site for redness, swelling, and drainage. Call the device clinic at [phone] if you experience these symptoms or fever/chills.  If your incision is sealed with  Steri-strips or staples, you may shower 7 days after your procedure or when told by your provider. Do not remove the steri-strips or let the shower hit directly on your site. You may wash around your site with soap and water .    If you were discharged in a sling, please do not wear this during the day more than 48 hours after your surgery unless otherwise instructed. This may increase the risk of stiffness and soreness in your shoulder.   Avoid lotions, ointments, or perfumes over your incision until it is well-healed.  You may use a hot tub or a pool AFTER your wound check appointment if the incision is completely closed.  Pacemaker Alerts:  Some alerts are vibratory and others beep. These are NOT emergencies. Please call our office to let us  know. If this occurs at night or on weekends, it can wait until the next business day. Send a remote transmission.  If your device is capable of reading fluid status (for heart failure), you will be offered monthly monitoring to review this with you.   DEVICE MANAGEMENT Remote monitoring is used to monitor your pacemaker from home. This monitoring is scheduled every 91 days by our office. It allows us  to keep an eye on the functioning of your device to ensure it is working properly. You will routinely see your Electrophysiologist annually (more often if necessary).  This will appear as a REMOTE check on your  MyChart schedule. These are automatic and there is nothing for you to manually do unless otherwise instructed.  You should receive your ID card for your new device in 4-8 weeks. Keep this card with you at all times once received. Consider wearing a medical alert bracelet or necklace.  Your Pacemaker may be MRI compatible. This will be discussed at your next office visit/wound check.  You should avoid contact with strong electric or magnetic fields.   Do not use amateur (ham) radio equipment or electric (arc) welding torches. MP3 player headphones with  magnets should not be used. Some devices are safe to use if held at least 12 inches (30 cm) from your Pacemaker. These include power tools, lawn mowers, and speakers. If you are unsure if something is safe to use, ask your health care provider.  When using your cell phone, hold it to the ear that is on the opposite side from the Pacemaker. Do not leave your cell phone in a pocket over the Pacemaker.  You may safely use electric blankets, heating pads, computers, and microwave ovens.  Call the office right away if: You have chest pain. You feel more short of breath than you have felt before. You feel more light-headed than you have felt before. Your incision starts to open up.  This information is not intended to replace advice given to you by your health care provider. Make sure you discuss any questions you have with your health care provider.

## 2024-11-25 NOTE — TOC Progression Note (Addendum)
 Transition of Care [facility]) - Progression Note    Patient Details  Name: Shannon Blevins MRN: [id number] Date of Birth: 12-Jul-1946  Transition of [facility] CM/SW Contact  Arlana JINNY Nicholaus [place]RA[place] Phone Number: [phone] 11/25/2024, 10:46 AM  Clinical Narrative:   HF CSW met with patient at bedside and presented accepted SNF bed offers list. CSW left at bedside for review. Will follow up about SNF choice soon.   Current accepted bed offers:   Skilled Nursing Rehab Facilities-   [email or web address]     Ratings out of 5 stars (5 the highest)    Name Address  Phone # Quality Care Staffing Health Inspection Overall  [facility] [address] [place] [phone] 2 1 2   [facility] [address], [place] [phone] 3 4 4   [facility]  [address], [place] [phone] 4 3 5 5   [facility] [address] [place] [phone] 5 3 2 3   [facility] [address] [place] [phone] 5 1 2 2   [facility] [phone] [address] [place] [phone] 4 4 4   [facility] [address], [place] [phone] 2 1 1 1   [facility] [address], [place] [phone] 4 2 1 1     HF CSW/CM will continue to follow and monitor for dc readiness.   Expected Discharge Plan: Skilled Nursing Facility Barriers to Discharge: Continued Medical Work up, English As A Second Language Teacher, SNF Pending bed offer               Expected Discharge Plan and Services       Living arrangements for the past 2 months: Single Family Home                                       Social Drivers of Health (SDOH) Interventions SDOH Screenings   Food Insecurity: No Food Insecurity (11/23/2024)  Housing: Low Risk (11/23/2024)  Transportation Needs: No Transportation Needs (11/23/2024)  Utilities: Not At Risk (11/23/2024)  Social Connections: Socially Integrated  (11/23/2024)  Tobacco Use: Medium Risk (11/23/2024)    Readmission Risk Interventions     No data to display

## 2024-11-25 NOTE — Plan of Care (Signed)

## 2024-11-25 NOTE — Progress Notes (Signed)
 "    Advanced Heart Failure Rounding Note  Cardiologist: None  Chief Complaint: Bradycardia Patient Profile   Shannon Blevins is a 78 y.o. female with history of HTN, hyperlipidemia, iron deficiency anemia, hypothyroidism. No known cardiac history per patient (but history limited). Presented after a fall and subsequently admitted for symptomatic bradycardia and acute renal failure.   Significant events:   12/18 - Bradycardia with 2:1 AV block, s/p TVP  Subjective:    Continue to requiring pacing. VP 70. Threshold 6.0 mA  This am had 2:1 HB underneath followed by sinus brady in 30s.   Feels ok    Objective:    Weight Range: 71.2 kg Body mass index is 28.71 kg/m.   Vital Signs:   Temp:  [97.1 F (36.2 C)-98.3 F (36.8 C)] 97.1 F (36.2 C) (12/26 0700) Pulse Rate:  [62-70] 70 (12/26 0700) Resp:  [11-19] 11 (12/26 0700) BP: (99-155)/(57-90) 128/71 (12/26 0700) SpO2:  [92 %-98 %] 97 % (12/26 0700) Weight:  [71.2 kg] 71.2 kg (12/26 0500) Last BM Date : 11/24/24  Weight change: Filed Weights   11/23/24 0108 11/24/24 0500 11/25/24 0500  Weight: 70.4 kg 73.8 kg 71.2 kg   Intake/Output:  Intake/Output Summary (Last 24 hours) at 11/25/2024 0849 Last data filed at 11/25/2024 0600 Gross per 24 hour  Intake --  Output 950 ml  Net -950 ml    Physical Exam   General:  Sitting up in bed. No resp difficulty HEENT: normal Neck: supple. no JVD.  RIJ TVP Cor: Regular rate & rhythm. No rubs, gallops or murmurs. Lungs: clear Abdomen: soft, nontender, nondistended.Good bowel sounds. Extremities: no cyanosis, clubbing, rash, edema Neuro: alert & orientedx3, cranial nerves grossly intact. moves all 4 extremities w/o difficulty. Affect pleasant    Telemetry   VP 70s underlying sinus brady 30-40s with intermittent 2:1HB Personally reviewed   Labs   CBC Recent Labs    11/23/24 0312  WBC 6.6  HGB 8.9*  HCT 26.8*  MCV 90.2  PLT 175   Basic Metabolic Panel Recent  Labs    11/24/24 0215 11/25/24 0218  NA 139 140  K 4.1 4.3  CL 106 107  CO2 23 24  GLUCOSE 103* 97  BUN 39* 33*  CREATININE 2.58* 2.51*  CALCIUM  9.1 9.1  MG 2.1 1.7  PHOS 3.2 3.0   Liver Function Tests Recent Labs    11/24/24 0215 11/25/24 0218  ALBUMIN 3.0* 3.0*   ProBNP (last 3 results) Recent Labs    11/17/24 2032  PROBNP 2,351.0*   Medications:    Scheduled Medications:  [MAR Hold] atorvastatin   10 mg Oral QPM   [MAR Hold] brexpiprazole   0.5 mg Oral Daily   chlorhexidine   60 mL Topical Once   chlorhexidine   60 mL Topical Once   [MAR Hold] Chlorhexidine  Gluconate Cloth  6 each Topical Daily   [MAR Hold] ferrous sulfate   325 mg Oral Q breakfast   gentamicin  (GARAMYCIN ) 80 mg in sodium chloride  0.9 % 500 mL irrigation  80 mg Irrigation On Call   [MAR Hold] heparin   5,000 Units Subcutaneous Q8H   [MAR Hold] levothyroxine   137 mcg Oral QAC breakfast   [MAR Hold] PARoxetine   20 mg Oral Daily   sodium chloride  0.9 % with gentamicin  (GARAMYCIN ) ADS Med       sodium chloride  flush  3 mL Intravenous Q12H   [MAR Hold] thiamine   100 mg Oral Daily    Infusions:  sodium chloride   ceFAZolin       ceFAZolin  (ANCEF ) IV       PRN Medications: [MAR Hold] acetaminophen , ceFAZolin , [MAR Hold] docusate sodium , [MAR Hold] ondansetron  (ZOFRAN ) IV, [MAR Hold] mouth rinse, [MAR Hold] polyethylene glycol, sodium chloride  0.9 % with gentamicin  (GARAMYCIN ) ADS Med, sodium chloride  flush  Assessment/Plan   1. Bradycardia: 2:1 AVB in setting of AKI with atenolol and diltiazem use. Also sinus node dysfunction. No chest pain.  Echo shows preserved LV EF 55-60%, mild RV dilation with normal RV function. TSH was normal.  - Off nodal blockade.  - Continues with underlying sinus bradycardia in 40s. And 2:1 HB - Thresholds increasing on TVP - D/w EP. Plan PPM today  2. AKI: Scr in 7/24 was 1.2. Creatinine 3 afe weeks ago, up to 10.5 with BUN 124 on admission.  BC Powder use may play a  role.  Also with bradycardia, nausea, vomiting and diarrhea.  All these could have triggered a prerenal insult proceeding to ATN.  She additionally had a metabolic acidosis though K is normal.  - Scr has leveled out at 2.5  - Off telmisartan/hydrochlorothiazide.   3. Hypothyroidism: Continue Levoxyl , TSH is normal.   4. Hypokalemia/hypomagnesemia: -  K 4.3 Mg 1.7 - supp mag  5. Atrial fibrillation: Noted to have some slow atrial fibrillation on telemetry that seems to have been transient.   - if AF recurs may need AC - no recurrence   6. E. Coli UTI - Treated with ceftriaxone    CRITICAL CARE Performed by: Toribio Fuel  Total critical care time:32 minutes  -Critical care time was exclusive of separately billable procedures and treating other patients. -Critical care was necessary to treat or prevent imminent or life-threatening deterioration. -Critical care was time spent personally by me on the following activities: development of treatment plan with patient and/or surrogate as well as nursing, discussions with consultants, evaluation of patient's response to treatment, examination of patient, obtaining history from patient or surrogate, ordering and performing treatments and interventions, ordering and review of laboratory studies, ordering and review of radiographic studies, pulse oximetry and re-evaluation of patient's condition.   Length of Stay: 8  Toribio Fuel, MD  11/25/2024, 8:49 AM  Advanced Heart Failure Team Pager [phone] (M-F; 7a - 5p)   Please visit [email or web address]: For overnight coverage please call cardiology fellow first. If fellow not available call Shock/ECMO MD on call.  For ECMO / Mechanical Support (Impella, IABP, LVAD) issues call Shock / ECMO MD on call.      "

## 2024-11-25 NOTE — Progress Notes (Signed)
 PT Cancellation Note  Patient Details Name: Shannon Blevins MRN: [id number] DOB: May 03, 1946   Cancelled Treatment:    Reason Eval/Treat Not Completed: (P) Patient at procedure or test/unavailable. Will plan to follow-up later if time permits.   Theo Ferretti, PT, DPT Acute Rehabilitation Services  Office: [phone]    Theo CHRISTELLA Ferretti 11/25/2024, 9:35 AM

## 2024-11-25 NOTE — Progress Notes (Signed)
" ° °  NAME:  Shannon Blevins, MRN:  [id number], DOB:  February 25, 1946, LOS: 8 ADMISSION DATE:  11/17/2024, CONSULTATION DATE:  12/18 REFERRING MD:  Patsey, EDP CHIEF COMPLAINT:  bradycardia   History of Present Illness:  78 year old female with past medical history of asthma, hypertension, hypothyroidism, depression, iron deficiency anemia, hyperlipidemia who presents to the ER after fall. Daughter called EMS after patient fall at home, striking her face. Noted several days of nausea, vomiting, diarrhea. Daughter also notes more confusion over the past few weeks. In ED found to have heart blood with HR 30s, hypotensive. Was given 1mg  atropine  with no improvement. Given IVF. Cardiology consulted and taking to cath lab for TVP. CCM asked to admit.   Of note I-stat chem 8 w/ BUN >130, sCr 11.5. 2 weeks ago her labs showed BUN 14, sCr 3.05.   Pertinent  Medical History  Hypertension, HLD, hypothyroid, depression, IDA, asthma   Significant Hospital Events: Including procedures, antibiotic start and stop dates in addition to other pertinent events   12/18: admit for CHB, had TVP placed 12/22 IVF KVO. Renal fxn better. MS improved. Still req pacer  12/23 still requires intermittent pacing with rate turndown  12/24 continued require pacing, stated feeling better, serum creatinine continued to trend down  Interim History / Subjective:  No issues overnight.  Underlying rhythm remains low at 39 Objective   Blood pressure 128/71, pulse 70, temperature (!) 97.1 F (36.2 C), temperature source Axillary, resp. rate 11, height 5' 2 (1.575 m), weight 71.2 kg, SpO2 97%.        Intake/Output Summary (Last 24 hours) at 11/25/2024 0825 Last data filed at 11/25/2024 0600 Gross per 24 hour  Intake --  Output 950 ml  Net -950 ml   Filed Weights   11/23/24 0108 11/24/24 0500 11/25/24 0500  Weight: 70.4 kg 73.8 kg 71.2 kg    Physical exam: General resting in bed no acute events HEENT normocephalic  atraumatic no jugular venous distention, facial ecchymosis improving Pulmonary clear to auscultation Cardiac paced rhythm  abdomen soft nontender Extremities warm dry brisk cap refill GU clear yellow  Resolved Hospital Problem list   Hypovolemic shock Hypokalemia Acute metabolic/uremic encephalopathy Hypomagnesemia Urinary tract infection treated Assessment & Plan:  Sinus node dysfunction with High degree AV block/junctional rhythm Plan Continue telemetry monitoring Holding beta-blockade Plan for permanent pacemaker today  AKI due to cardiorenal syndrome, continue to improve Plan  renal dose medication Strict intake output Avoid hypovolemia  Hypothyroidism Plan Continue Synthroid   Hyperlipidemia Plan Continue statin  Iron deficiency anemia Plan Con iron supplementation   My time 32 min included: review of most recent records, direct face to face time obtaining history, performing physical exam, developing and documenting plan as well as discussing this plan with the patient and/or care givers.  [address]231 on-going, stable,recovering or improving problem >25 min   "

## 2024-11-25 NOTE — Progress Notes (Signed)
"  °  Patient Name: Shannon Blevins Date of Encounter: 11/25/2024  Primary Cardiologist: None Electrophysiologist: None  Interval Summary   NAEON. Sleeping this AM  Cr continues to improve, 2.25 this AM  Vital Signs    Vitals:   11/25/24 0400 11/25/24 0500 11/25/24 0600 11/25/24 0700  BP: 117/77 (!) 155/70 (!) 142/82 128/71  Pulse: 70 70 69 70  Resp: 12 12 15 11   Temp:      TempSrc:      SpO2: 95% 92% 97% 97%  Weight:  71.2 kg    Height:        Intake/Output Summary (Last 24 hours) at 11/25/2024 0730 Last data filed at 11/25/2024 0600 Gross per 24 hour  Intake --  Output 1050 ml  Net -1050 ml   Filed Weights   11/23/24 0108 11/24/24 0500 11/25/24 0500  Weight: 70.4 kg 73.8 kg 71.2 kg    Physical Exam    GEN- NAD, sleeping in bed Lungs- Clear to ausculation bilaterally, normal work of breathing Cardiac- Regular rate and rhythm, no murmurs, rubs or gallops GI- soft, NT, ND, + BS Extremities- no clubbing or cyanosis. No edema  Telemetry    2:1 HB intrinsic  (personally reviewed)  Hospital Course    Shannon Blevins is a 78 y.o. female with PMH of HTN, HLD, IDA, hypothyroid, CKD admitted for fall at home, found to be profoundly bradycardic with Cr of 11/BUN 130  Assessment & Plan    #) SND vs junctional bradycardia  #) TVP in place #) AKI, improving Remains in 2:1 HB this AM TVP set at 70 NPO for PPM implant today, planning dual chamber   #) anemia #) UTI Per primary UTI treated.       For questions or updates, please contact [facility] Please consult [email or web address] for contact info under     Signed, Garet Hooton, NP  11/25/2024, 7:30 AM   "

## 2024-11-25 NOTE — Plan of Care (Signed)
" °  Problem: Education: Goal: Knowledge of General Education information will improve Description: Including pain rating scale, medication(s)/side effects and non-pharmacologic comfort measures Outcome: Progressing   Problem: Health Behavior/Discharge Planning: Goal: Ability to manage health-related needs will improve Outcome: Progressing   Problem: Clinical Measurements: Goal: Ability to maintain clinical measurements within normal limits will improve Outcome: Progressing Goal: Will remain free from infection Outcome: Progressing Goal: Diagnostic test results will improve Outcome: Progressing Goal: Respiratory complications will improve Outcome: Progressing Goal: Cardiovascular complication will be avoided Outcome: Progressing   Problem: Activity: Goal: Risk for activity intolerance will decrease Outcome: Progressing   Problem: Nutrition: Goal: Adequate nutrition will be maintained Outcome: Progressing   Problem: Coping: Goal: Level of anxiety will decrease Outcome: Progressing   Problem: Elimination: Goal: Will not experience complications related to bowel motility Outcome: Progressing Goal: Will not experience complications related to urinary retention Outcome: Progressing   Problem: Pain Managment: Goal: General experience of comfort will improve and/or be controlled Outcome: Progressing   Problem: Safety: Goal: Ability to remain free from injury will improve Outcome: Progressing   Problem: Skin Integrity: Goal: Risk for impaired skin integrity will decrease Outcome: Progressing   Problem: Education: Goal: Knowledge of cardiac device and self-care will improve Outcome: Progressing Goal: Ability to safely manage health related needs after discharge will improve Outcome: Progressing   Problem: Cardiac: Goal: Ability to achieve and maintain adequate cardiopulmonary perfusion will improve Outcome: Progressing   "

## 2024-11-26 ENCOUNTER — Inpatient Hospital Stay (HOSPITAL_COMMUNITY)

## 2024-11-26 ENCOUNTER — Other Ambulatory Visit (HOSPITAL_COMMUNITY): Payer: Self-pay

## 2024-11-26 DIAGNOSIS — Z950 Presence of cardiac pacemaker: Secondary | ICD-10-CM

## 2024-11-26 DIAGNOSIS — R5381 Other malaise: Secondary | ICD-10-CM

## 2024-11-26 LAB — RENAL FUNCTION PANEL
Albumin: 3.1 g/dL — ABNORMAL LOW (ref 3.5–5.0)
Anion gap: 11 (ref 5–15)
BUN: 29 mg/dL — ABNORMAL HIGH (ref 8–23)
CO2: 21 mmol/L — ABNORMAL LOW (ref 22–32)
Calcium: 9.4 mg/dL (ref 8.9–10.3)
Chloride: 106 mmol/L (ref 98–111)
Creatinine, Ser: 2.19 mg/dL — ABNORMAL HIGH (ref 0.44–1.00)
GFR, Estimated: 23 mL/min — ABNORMAL LOW
Glucose, Bld: 131 mg/dL — ABNORMAL HIGH (ref 70–99)
Phosphorus: 2.9 mg/dL (ref 2.5–4.6)
Potassium: 3.7 mmol/L (ref 3.5–5.1)
Sodium: 138 mmol/L (ref 135–145)

## 2024-11-26 LAB — MAGNESIUM: Magnesium: 2.2 mg/dL (ref 1.7–2.4)

## 2024-11-26 MED ORDER — VITAMIN B-1 100 MG PO TABS: 100.0000 mg | ORAL_TABLET | Freq: Every day | ORAL | 1 refills | Status: AC

## 2024-11-26 MED ORDER — FERROUS SULFATE 325 (65 FE) MG PO TABS: 325.0000 mg | ORAL_TABLET | Freq: Every day | ORAL | 3 refills | Status: AC

## 2024-11-26 NOTE — Progress Notes (Signed)
 Physical Therapy Treatment Patient Details Name: Shannon Blevins MRN: [id number] DOB: Feb 26, 1946 Today's Date: 11/26/2024   History of Present Illness Pt is a 78 yo female who presented to [facility] on 12/18 after EMS arrived at home to assess pt's husband and found pt had fallen at home, striking her face, was hypotensive, had increased confusion, and was noted to have several days of nausea, vomiting, and diarrhea. Pt admitted with symptomatic bradycardia, acute renal failure, and uremic encephalopathy. Pt s/p TVP placement on 12/18. 12/26 PPM. PMH: HTN, HLD, hypothyroid, depression, IDA, asthma, iron deficiency anemia   PT Comments  Pt received in supine and agreeable to PT session. Focused session on PPM precaution education and safety with mobility for return home. Pt has made significant progress towards acute PT goals requiring supervision/CGA for all mobility. Pt was able to perform all mobility while adhering to PPM precautions with minimal cueing for adherence. Pt improved by increasing gait distance to 340ft with use of RW. Also able to negotiate 3 steps with sideways step to pattern and one handrail. Discussed and demonstrated proper guarding with pt stating her daughter will be there to assist. Pt was calling daughter at end of session to discuss having 24/7 assist for a short period after hospital d/c. Pt feels comfortable d/c home with updated recommendations for HHPT with DME listed below. Will continue to follow acutely.    If plan is discharge home, recommend the following: A little help with walking and/or transfers;A little help with bathing/dressing/bathroom;Assist for transportation;Help with stairs or ramp for entrance   Can travel by private vehicle      Yes  Equipment Recommendations  Rolling walker (2 wheels);BSC/3in1       Precautions / Restrictions Precautions Precautions: Fall;ICD/Pacemaker Recall of Precautions/Restrictions: Impaired Precaution/Restrictions Comments: PPM  12/26, fell during admission Restrictions Other Position/Activity Restrictions: PPM precautions     Mobility  Bed Mobility Overal bed mobility: Needs Assistance Bed Mobility: Supine to Sit, Sit to Supine    Supine to sit: Supervision Sit to supine: Supervision   General bed mobility comments: exited to the right with pt moving into long-sitting. Able to shift hips towards EOB with R UE pushing on EOB    Transfers Overall transfer level: Needs assistance Equipment used: Rolling walker (2 wheels) Transfers: Sit to/from Stand Sit to Stand: Contact guard assist, Supervision    General transfer comment: CGA/supervision to stand with cueing to not push with L UE    Ambulation/Gait Ambulation/Gait assistance: Contact guard assist Gait Distance (Feet): 300 Feet Assistive device: Rolling walker (2 wheels) Gait Pattern/deviations: Step-through pattern, Decreased stride length, Trunk flexed, Shuffle Gait velocity: decr    General Gait Details: Slow and steady gait with limited foot clearance   Stairs Stairs: Yes Stairs assistance: Contact guard assist Stair Management: One rail Left, Step to pattern, Sideways Number of Stairs: 3 General stair comments: sideways step to pattern with R hand on handrail. Cueing for technique with CGA for safety     Balance Overall balance assessment: Needs assistance Sitting-balance support: Single extremity supported, Feet supported Sitting balance-Leahy Scale: Fair    Standing balance support: Bilateral upper extremity supported, During functional activity, Reliant on assistive device for balance Standing balance-Leahy Scale: Poor     Communication Communication Communication: No apparent difficulties  Cognition Arousal: Alert Behavior During Therapy: Flat affect   PT - Cognitive impairments: No family/caregiver present to determine baseline, Problem solving, Safety/Judgement    Following commands: Impaired Following commands impaired:  Follows one step  commands with increased time    Cueing Cueing Techniques: Verbal cues, Gestural cues, Visual cues         Pertinent Vitals/Pain Pain Assessment Pain Assessment: Faces Faces Pain Scale: Hurts a little bit Pain Location: L knee with ROM Pain Descriptors / Indicators: Discomfort Pain Intervention(s): Limited activity within patient's tolerance, Monitored during session, Repositioned     PT Goals (current goals can now be found in the care plan section) Progress towards PT goals: Progressing toward goals    Frequency    Min 2X/week       AM-PAC PT 6 Clicks Mobility   Outcome Measure  Help needed turning from your back to your side while in a flat bed without using bedrails?: A Little Help needed moving from lying on your back to sitting on the side of a flat bed without using bedrails?: A Little Help needed moving to and from a bed to a chair (including a wheelchair)?: A Little Help needed standing up from a chair using your arms (e.g., wheelchair or bedside chair)?: A Little Help needed to walk in hospital room?: A Little Help needed climbing 3-5 steps with a railing? : A Little 6 Click Score: 18    End of Session Equipment Utilized During Treatment: Gait belt Activity Tolerance: Patient tolerated treatment well Patient left: in bed;with call bell/phone within reach;with bed alarm set Nurse Communication: Mobility status PT Visit Diagnosis: Unsteadiness on feet (R26.81);Repeated falls (R29.6);Muscle weakness (generalized) (M62.81)     Time: 8648-8572 PT Time Calculation (min) (ACUTE ONLY): 36 min  Charges:    $Gait Training: 8-22 mins $Therapeutic Activity: 8-22 mins PT General Charges $$ ACUTE PT VISIT: 1 Visit                    Kate ORN, PT, DPT Secure Chat Preferred  Rehab Office [phone]   Kate BRAVO [name] 11/26/2024, 2:54 PM

## 2024-11-26 NOTE — Discharge Summary (Signed)
 " Physician Discharge Summary   Patient: Shannon Blevins MRN: [id number] DOB: 03/07/1946  Admit date:     11/17/2024  Discharge date: 11/26/2024  Discharge Physician: Concepcion Riser   PCP: Valma Carwin, MD   Recommendations at discharge:   PCP follow up in 1 week Repeat BMP in 1 week. EP cardiology follow up as scheduled.  Discharge Diagnoses: Principal Problem:   Heart block Active Problems:   Symptomatic bradycardia   AKI (acute kidney injury)   AV block, 2nd degree   Physical deconditioning   S/P placement of cardiac pacemaker  Resolved Problems:   * No resolved hospital problems. *  Hospital Course: Shannon Blevins is a 78 year old female with past medical history of asthma, hypertension, hypothyroidism, depression, iron deficiency anemia, hyperlipidemia presented to the ER after fall. Daughter called EMS after patient fall at home, striking her face. Noted several days of nausea, vomiting, diarrhea. Daughter also notes more confusion over the past few weeks. In ED, she was found to have HR 30s, hypotensive, creatinine 11.5. Was given 1mg  atropine  with no improvement. Given IV fluids. Cardiology consulted and taken to cath lab for TVP.  Patient is admitted for close monitoring in ICU. Her kidney function gradually improved with fluids, required consistent pacing. She had permanent pacemaker placed 12/26 transferred to [facility] service for further management. Seen by PT/ OT advised HH services with rolling walker,   Assessment and Plan: Sinus node dysfunction with High degree AV block/junctional rhythm Syncope Symptomatic bradycardia S/p permanent pacemaker 12/26. Followed by EP, repeat chest xray with good lead placement. Wound good. She is hemodynamically stable for discharge. Stopped Atenolol, telmisartan. Advised PCP follow up in 1 week, EP follow up as scheduled for wound check.   AKI due to cardiorenal syndrome, dehydration.  Continues to improve Presented with  creatinine 11.5, improved with fluids. Today creatinine 2.19 She likely has chronic component. Need PCP follow up. Repeat BMP as outpatient in 1 week.   Hypothyroidism Continue Synthroid    Hyperlipidemia Continue statin   Iron deficiency anemia Con iron supplementation        Consultants: PCCM, Cardiology, EP Procedures performed: pacemaker placement.  Disposition: Home Diet recommendation:  Discharge Diet Orders (From admission, onward)     Start     Ordered   11/26/24 0000  Diet - low sodium heart healthy        11/26/24 1438           Cardiac diet DISCHARGE MEDICATION: Allergies as of 11/26/2024       Reactions   Sulfa Antibiotics Nausea And Vomiting        Medication List     STOP taking these medications    atenolol 50 MG tablet Commonly known as: TENORMIN   BC HEADACHE POWDER PO   butalbital-apap-caffeine-codeine 50-325-40-30 MG capsule Commonly known as: FIORICET WITH CODEINE   estradiol 2 MG tablet Commonly known as: ESTRACE   telmisartan-hydrochlorothiazide 80-25 MG tablet Commonly known as: MICARDIS HCT       TAKE these medications    atorvastatin  10 MG tablet Commonly known as: LIPITOR Take 10 mg by mouth every evening.   clobetasol cream 0.05 % Commonly known as: TEMOVATE Apply 1 Application topically daily as needed (for rash).   ferrous sulfate  325 (65 FE) MG tablet Take 1 tablet (325 mg total) by mouth daily with breakfast. Start taking on: November 27, 2024   Gemtesa 75 MG Tabs Generic drug: Vibegron Take 1 tablet by mouth daily.   levothyroxine   125 MCG tablet Commonly known as: SYNTHROID  Take 125 mcg by mouth daily before breakfast. What changed: Another medication with the same name was removed. Continue taking this medication, and follow the directions you see here.   PARoxetine  20 MG tablet Commonly known as: PAXIL  Take 20 mg by mouth daily.   tacrolimus  0.1 % ointment Commonly known as: PROTOPIC  Apply  topically 2 (two) times daily. What changed:  how much to take when to take this reasons to take this   thiamine  100 MG tablet Commonly known as: Vitamin B-1 Take 1 tablet (100 mg total) by mouth daily. Start taking on: November 27, 2024               Durable Medical Equipment  (From admission, onward)           Start     Ordered   11/26/24 1517  For home use only DME Walker rolling  Once       Question Answer Comment  Walker: With 5 Inch Wheels   Patient needs a walker to treat with the following condition Weakness      11/26/24 1516   11/26/24 1435  DME Walker  Once       Question Answer Comment  Walker: With 5 Inch Wheels   Patient needs a walker to treat with the following condition Physical debility      11/26/24 1438   11/26/24 0000  DME Bedside commode       Question:  Patient needs a bedside commode to treat with the following condition  Answer:  Physical debility   11/26/24 1438              Discharge Care Instructions  (From admission, onward)           Start     Ordered   11/26/24 0000  Leave dressing on - Keep it clean, dry, and intact until clinic visit        11/26/24 1438            Follow-up Information     [facility] Follow up.   Specialty: Home Health Services Why: Physical and Occupational therapy services. Office will call to arrange follow up after hospital discharge. Contact information: [address] [place] [address] [phone]                Discharge Exam: Filed Weights   11/24/24 0500 11/25/24 0500 11/26/24 0114  Weight: 73.8 kg 71.2 kg 69.3 kg      11/26/2024    3:59 PM 11/26/2024   12:27 PM 11/26/2024    7:31 AM  Vitals with BMI  Systolic 142 140 878  Diastolic 75 69 69  Pulse  80 84    General - Elderly Caucasian/ female, no apparent distress HEENT - PERRLA, EOMI, atraumatic head, non tender sinuses. Lung - Clear, no rales, rhonchi,  wheezes. Heart - S1, S2 heard, no murmurs, rubs, pacemaker dressing noted.  Abdomen - Soft, non tender, bowel sounds good Neuro - Alert, awake and oriented x 3, non focal exam. Skin - Warm and dry.  Condition at discharge: stable  The results of significant diagnostics from this hospitalization (including imaging, microbiology, ancillary and laboratory) are listed below for reference.   Imaging Studies: DG Chest 2 View Result Date: 11/26/2024 CLINICAL DATA:  Cardiac device in-situ. EXAM: CHEST - 2 VIEW COMPARISON:  11/18/2024 FINDINGS: The previous right internal jugular pacemaker is been removed. There  is a new dual lead left-sided pacemaker with lead tips projecting over the right atrium and ventricle. No pneumothorax. Stable heart size and mediastinal contours. No pulmonary edema. Mild atelectasis at the left lung base. No large pleural effusion. IMPRESSION: New dual lead left-sided pacemaker with lead tips projecting over the right atrium and ventricle. No pneumothorax. Electronically Signed   By: Andrea Gasman M.D.   On: 11/26/2024 13:36   EP PPM/ICD IMPLANT Result Date: 11/25/2024 Table formatting from the original result was not included.  SURGEON:  Eulas Furbish, MD    PREPROCEDURE DIAGNOSES:  1. 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 Dual chamber permanent pacemaker implantation    INTRODUCTION: Sedra A Maland is a 78 y.o. patient who presents to the EP lab for dual chamber permanent pacemaker implantation.    DESCRIPTION OF PROCEDURE:  Informed written consent was obtained and the patient was brought to the electrophysiology lab in the fasting state. The patient was adequately sedated with intravenous Versed , and fentanyl  as outlined in the nursing report.  The patient's left chest was prepped and draped in the usual sterile fashion by the EP lab staff.  The skin  overlying the left deltopectoral region was infiltrated with lidocaine  for local analgesia.  An incision was created over the left deltopectoral region.  A left subcutaneous pocket was fashioned using a combination of sharp and blunt dissection immediately superficial to the pectoral fascia.  Electrocautery was used to assure hemostasis. Lead Placement: The left axillary vein was cannulated using modified seldinger techniques.  Through the left axillary vein, an RV pace/sense lead was advanced to the RV mid septum and advanced deeply, pausing intermittently to assess lead parameters. The lead was secured to the pectoral fascia. Next, the right atrial lead was advanced to the RA appendage. It was secured to the pectoral fascia.  Lead parameters are detailed below.  RA RV Model [id number] [id number] Serial # [id number] Amplitude 3.0 mV 5.5 mV Threshold 1.25 V @ 0.4 S 0.75 V @ 0.4 S Impedence 665 ohms 855 ohms The pocket was irrigated with copious gentamicin  solution.  The leads were then  connected to a pulse generator (model Medtronic Azure, serial [id number]). The pocket was closed in layers of absorbable suture. EBL < 10mL. Steri-strips and a sterile dressing were applied.  During this procedure the patient is administered Versed  and Fentanyl  by a trained provider under my direct supervision to achieve and maintain moderate conscious sedation.  The patient's heart rate, blood pressure, and oxygen saturation are monitored continuously during the procedure. The period of conscious sedation is 33 minutes, of which I was present face-to-face 100% of this time.    CONCLUSIONS:  1. Successful dual chamber permanent pacemaker implantation  3.  No early apparent complications. Eulas Furbish, MD Cardiac Electrophysiology   US  RENAL Result Date: 11/19/2024 EXAM: US  Retroperitoneum Complete, Renal. 11/19/2024 10:05:08 PM TECHNIQUE: Real-time ultrasonography of the kidneys  was performed. COMPARISON: None available  CLINICAL HISTORY: 409830 AKI (acute kidney injury) 409830 AKI (acute kidney injury) FINDINGS: FINDINGS: RIGHT KIDNEY/URETER: Right kidney measures 9.2 x 4.6 x 4.5 cm. Calculated volume is 98 ml. Normal cortical echogenicity. No hydronephrosis. No calculus. No mass. LEFT KIDNEY/URETER: The left kidney measures 9.3 x 4.9 x 4.4 cm. The calculated volume is 105 ml. Normal cortical echogenicity. No hydronephrosis. No calculus. No mass. BLADDER: The bladder is decompressed by  a foley catheter. IMPRESSION: 1. No acute findings. Electronically signed by: Oneil Devonshire MD 11/19/2024 10:11 PM EST RP Workstation: [id number]   DG CHEST PORT 1 VIEW Result Date: 11/18/2024 CLINICAL DATA:  Hypoxemia EXAM: PORTABLE CHEST 1 VIEW COMPARISON:  None Available. FINDINGS: Temporary pacing wire is noted extending over the right ventricle. Cardiac shadow is within normal limits. Aortic calcifications are seen without aneurysmal dilatation. Lungs are clear bilaterally. No bony abnormality is noted. IMPRESSION: No active disease. Electronically Signed   By: Oneil Devonshire M.D.   On: 11/18/2024 20:56   ECHOCARDIOGRAM COMPLETE Result Date: 11/17/2024    ECHOCARDIOGRAM REPORT   Patient Name:   Bethsaida A Ermis Date of Exam: 11/17/2024 Medical Rec #:  [id number]       Height:       62.5 in Accession #:    [id number]      Weight:       148.0 lb Date of Birth:  Dec 03, 1945        BSA:          1.692 m Patient Age:    78 years        BP:           120/88 mmHg Patient Gender: F               HR:           70 bpm. Exam Location:  Inpatient Procedure: 2D Echo (Both Spectral and Color Flow Doppler were utilized during            procedure). STAT ECHO Indications:    complete heart block  History:        Patient has no prior history of Echocardiogram examinations.                 Temporary pacemaker present, Arrythmias:Bradycardia; Risk                 Factors:Hypertension.  Sonographer:    Tinnie Barefoot RDCS Referring Phys: [id number] TINNIE FORBES FURTH  IMPRESSIONS  1. Left ventricular ejection fraction, by estimation, is 50 to 55%. Left ventricular ejection fraction by 2D MOD biplane is 53.0 %. The left ventricle has low normal function. The left ventricle demonstrates regional wall motion abnormalities (see scoring diagram/findings for description). There is mild concentric left ventricular hypertrophy. Left ventricular diastolic parameters are consistent with Grade I diastolic dysfunction (impaired relaxation).  2. Right ventricular systolic function is normal. The right ventricular size is mildly enlarged. There is normal pulmonary artery systolic pressure.  3. Left atrial size was moderately dilated.  4. The mitral valve is normal in structure. No evidence of mitral valve regurgitation. No evidence of mitral stenosis.  5. The aortic valve is tricuspid. Aortic valve regurgitation is trivial. No aortic stenosis is present.  6. The inferior vena cava is normal in size with greater than 50% respiratory variability, suggesting right atrial pressure of 3 mmHg. FINDINGS  Left Ventricle: Left ventricular ejection fraction, by estimation, is 50 to 55%. Left ventricular ejection fraction by 2D MOD biplane is 53.0 %. The left ventricle has low normal function. The left ventricle demonstrates regional wall motion abnormalities. The left ventricular internal cavity size was normal in size. There is mild concentric left ventricular hypertrophy. Left ventricular diastolic parameters are consistent with Grade I diastolic dysfunction (impaired relaxation).  LV Wall Scoring: The anterior septum is hypokinetic. The entire anterior wall, entire lateral wall, inferior septum, entire apex, and entire inferior wall are normal. Right Ventricle:  The right ventricular size is mildly enlarged. No increase in right ventricular wall thickness. Right ventricular systolic function is normal. There is normal pulmonary artery systolic pressure. The tricuspid regurgitant velocity is 2.00  m/s,  and with an assumed right atrial pressure of 3 mmHg, the estimated right ventricular systolic pressure is 19.0 mmHg. Left Atrium: Left atrial size was moderately dilated. Right Atrium: Right atrial size was normal in size. Pericardium: There is no evidence of pericardial effusion. Mitral Valve: The mitral valve is normal in structure. Mild mitral annular calcification. No evidence of mitral valve regurgitation. No evidence of mitral valve stenosis. Tricuspid Valve: The tricuspid valve is normal in structure. Tricuspid valve regurgitation is trivial. No evidence of tricuspid stenosis. Aortic Valve: The aortic valve is tricuspid. Aortic valve regurgitation is trivial. No aortic stenosis is present. Pulmonic Valve: The pulmonic valve was normal in structure. Pulmonic valve regurgitation is not visualized. No evidence of pulmonic stenosis. Aorta: The aortic root is normal in size and structure. Venous: The inferior vena cava is normal in size with greater than 50% respiratory variability, suggesting right atrial pressure of 3 mmHg. IAS/Shunts: No atrial level shunt detected by color flow Doppler.  LEFT VENTRICLE PLAX 2D                        Biplane EF (MOD) LVIDd:         4.10 cm         LV Biplane EF:   Left LVIDs:         3.00 cm                          ventricular LV PW:         1.10 cm                          ejection LV IVS:        1.10 cm                          fraction by LVOT diam:     2.00 cm                          2D MOD LV SV:         46                               biplane is LV SV Index:   27                               53.0 %. LVOT Area:     3.14 cm LV IVRT:       162 msec        Diastology                                LV e' medial:  4.35 cm/s                                LV e' lateral: 5.66 cm/s LV Volumes (MOD) LV vol d, MOD    41.2 ml A2C:  LV vol d, MOD    56.6 ml A4C: LV vol s, MOD    20.4 ml A2C: LV vol s, MOD    28.1 ml A4C: LV SV MOD A2C:   20.8 ml LV SV MOD A4C:   56.6 ml LV SV MOD  BP:    26.9 ml RIGHT VENTRICLE          IVC RV Basal diam:  2.40 cm  IVC diam: 1.80 cm TAPSE (M-mode): 2.0 cm LEFT ATRIUM             Index        RIGHT ATRIUM           Index LA diam:        3.50 cm 2.07 cm/m   RA Area:     12.90 cm LA Vol (A2C):   57.2 ml 33.80 ml/m  RA Volume:   30.90 ml  18.26 ml/m LA Vol (A4C):   55.6 ml 32.86 ml/m LA Biplane Vol: 58.7 ml 34.69 ml/m  AORTIC VALVE LVOT Vmax:   84.60 cm/s LVOT Vmean:  50.700 cm/s LVOT VTI:    0.146 m  AORTA Ao Root diam: 2.90 cm Ao Asc diam:  3.40 cm TRICUSPID VALVE TR Peak grad:   16.0 mmHg TR Vmax:        200.00 cm/s  SHUNTS Systemic VTI:  0.15 m Systemic Diam: 2.00 cm Annabella Scarce MD Electronically signed by Annabella Scarce MD Signature Date/Time: 11/17/2024/5:36:52 PM    Final    CARDIAC CATHETERIZATION Result Date: 11/17/2024 Successful placement of RIJ temporary pacing catheter   CT HEAD WO CONTRAST ( ) Result Date: 11/17/2024 EXAM: CT HEAD AND CERVICAL SPINE 11/17/2024 02:45:07 PM TECHNIQUE: CT of the head and cervical spine was performed without the administration of intravenous contrast. Multiplanar reformatted images are provided for review. Automated exposure control, iterative reconstruction, and/or weight based adjustment of the mA/kV was utilized to reduce the radiation dose to as low as reasonably achievable. COMPARISON: None available. CLINICAL HISTORY: Head trauma, moderate-severe. FINDINGS: CT HEAD BRAIN AND VENTRICLES: Hypoattenuating foci in the cerebral white matter, most likely representing chronic small vessel disease. Prominence of the sulci and ventricles is compatible with brain atrophy. No acute intracranial hemorrhage. No mass effect or midline shift. No abnormal extra-axial fluid collection. No evidence of acute infarct. No hydrocephalus. ORBITS: No acute abnormality. SINUSES AND MASTOIDS: No acute abnormality. SOFT TISSUES AND SKULL: Small superficial soft tissue contusion within the left supraorbital region. No  acute skull fracture. CT CERVICAL SPINE BONES AND ALIGNMENT: Stepwise 1st degree anterolisthesis of C4 on C5, C5 on C6, and C6 on C7. No acute fracture or traumatic malalignment. DEGENERATIVE CHANGES: Multilevel disc space narrowing and endplate spurring noted. SOFT TISSUES: No prevertebral soft tissue swelling. IMPRESSION: 1. No acute intracranial abnormality. 2. Chronic small vessel ischemic disease and brain atrophy. 3. Small superficial soft tissue contusion within the left supraorbital region. 4. No acute cervical spine fracture or traumatic malalignment. 5. Cervical spondylosis. Electronically signed by: Waddell Calk MD 11/17/2024 03:19 PM EST RP Workstation: [id number]   CT Cervical Spine Wo Contrast Result Date: 11/17/2024 EXAM: CT HEAD AND CERVICAL SPINE 11/17/2024 02:45:07 PM TECHNIQUE: CT of the head and cervical spine was performed without the administration of intravenous contrast. Multiplanar reformatted images are provided for review. Automated exposure control, iterative reconstruction, and/or weight based adjustment of the mA/kV was utilized to reduce the radiation dose to as low as reasonably achievable. COMPARISON: None available. CLINICAL HISTORY: Head trauma, moderate-severe.  FINDINGS: CT HEAD BRAIN AND VENTRICLES: Hypoattenuating foci in the cerebral white matter, most likely representing chronic small vessel disease. Prominence of the sulci and ventricles is compatible with brain atrophy. No acute intracranial hemorrhage. No mass effect or midline shift. No abnormal extra-axial fluid collection. No evidence of acute infarct. No hydrocephalus. ORBITS: No acute abnormality. SINUSES AND MASTOIDS: No acute abnormality. SOFT TISSUES AND SKULL: Small superficial soft tissue contusion within the left supraorbital region. No acute skull fracture. CT CERVICAL SPINE BONES AND ALIGNMENT: Stepwise 1st degree anterolisthesis of C4 on C5, C5 on C6, and C6 on C7. No acute fracture or traumatic  malalignment. DEGENERATIVE CHANGES: Multilevel disc space narrowing and endplate spurring noted. SOFT TISSUES: No prevertebral soft tissue swelling. IMPRESSION: 1. No acute intracranial abnormality. 2. Chronic small vessel ischemic disease and brain atrophy. 3. Small superficial soft tissue contusion within the left supraorbital region. 4. No acute cervical spine fracture or traumatic malalignment. 5. Cervical spondylosis. Electronically signed by: Waddell Calk MD 11/17/2024 03:19 PM EST RP Workstation: [id number]    Microbiology: Results for orders placed or performed during the hospital encounter of 11/17/24  MRSA Next Gen by PCR, Nasal     Status: None   Collection Time: 11/17/24  3:13 PM   Specimen: Nasal Mucosa; Nasal Swab  Result Value Ref Range Status   MRSA by PCR Next Gen NOT DETECTED NOT DETECTED Final    Comment: (NOTE) The GeneXpert MRSA Assay (FDA approved for NASAL specimens only), is one component of a comprehensive MRSA colonization surveillance program. It is not intended to diagnose MRSA infection nor to guide or monitor treatment for MRSA infections. Test performance is not FDA approved in patients less than 67 years old. Performed at [facility], [address], [place], [place] [address]   Urine Culture (for pregnant, neutropenic or urologic patients or patients with an indwelling urinary catheter)     Status: Abnormal   Collection Time: 11/19/24 11:40 AM   Specimen: Urine, Catheterized  Result Value Ref Range Status   Specimen Description URINE, CATHETERIZED  Final   Special Requests   Final    NONE Performed at [facility], [address], [place], [place] [address]    Culture >=100,000 COLONIES/mL ESCHERICHIA COLI (A)  Final   Report Status 11/21/2024 FINAL  Final   Organism ID, Bacteria ESCHERICHIA COLI (A)  Final      Susceptibility   Escherichia coli - MIC*    AMPICILLIN 8 SENSITIVE Sensitive     CEFAZOLIN  (URINE) Value in next row Sensitive       2 SENSITIVEThis is a modified FDA-approved test that has been validated and its performance characteristics determined by the reporting laboratory.  This laboratory is certified under the Clinical Laboratory Improvement Amendments CLIA as qualified to perform high complexity clinical laboratory testing.    CEFEPIME Value in next row Sensitive      2 SENSITIVEThis is a modified FDA-approved test that has been validated and its performance characteristics determined by the reporting laboratory.  This laboratory is certified under the Clinical Laboratory Improvement Amendments CLIA as qualified to perform high complexity clinical laboratory testing.    ERTAPENEM Value in next row Sensitive      2 SENSITIVEThis is a modified FDA-approved test that has been validated and its performance characteristics determined by the reporting laboratory.  This laboratory is certified under the Clinical Laboratory Improvement Amendments CLIA as qualified to perform high complexity clinical laboratory testing.    CEFTRIAXONE   Value in next row Sensitive      2 SENSITIVEThis is a modified FDA-approved test that has been validated and its performance characteristics determined by the reporting laboratory.  This laboratory is certified under the Clinical Laboratory Improvement Amendments CLIA as qualified to perform high complexity clinical laboratory testing.    CIPROFLOXACIN Value in next row Sensitive      2 SENSITIVEThis is a modified FDA-approved test that has been validated and its performance characteristics determined by the reporting laboratory.  This laboratory is certified under the Clinical Laboratory Improvement Amendments CLIA as qualified to perform high complexity clinical laboratory testing.    GENTAMICIN  Value in next row Sensitive      2 SENSITIVEThis is a modified FDA-approved test that has been validated and its performance characteristics determined by the reporting laboratory.  This laboratory is  certified under the Clinical Laboratory Improvement Amendments CLIA as qualified to perform high complexity clinical laboratory testing.    NITROFURANTOIN Value in next row Sensitive      2 SENSITIVEThis is a modified FDA-approved test that has been validated and its performance characteristics determined by the reporting laboratory.  This laboratory is certified under the Clinical Laboratory Improvement Amendments CLIA as qualified to perform high complexity clinical laboratory testing.    TRIMETH/SULFA Value in next row Sensitive      2 SENSITIVEThis is a modified FDA-approved test that has been validated and its performance characteristics determined by the reporting laboratory.  This laboratory is certified under the Clinical Laboratory Improvement Amendments CLIA as qualified to perform high complexity clinical laboratory testing.    AMPICILLIN/SULBACTAM Value in next row Sensitive      2 SENSITIVEThis is a modified FDA-approved test that has been validated and its performance characteristics determined by the reporting laboratory.  This laboratory is certified under the Clinical Laboratory Improvement Amendments CLIA as qualified to perform high complexity clinical laboratory testing.    PIP/TAZO Value in next row Sensitive      <=4 SENSITIVEThis is a modified FDA-approved test that has been validated and its performance characteristics determined by the reporting laboratory.  This laboratory is certified under the Clinical Laboratory Improvement Amendments CLIA as qualified to perform high complexity clinical laboratory testing.    MEROPENEM Value in next row Sensitive      <=4 SENSITIVEThis is a modified FDA-approved test that has been validated and its performance characteristics determined by the reporting laboratory.  This laboratory is certified under the Clinical Laboratory Improvement Amendments CLIA as qualified to perform high complexity clinical laboratory testing.    * >=100,000  COLONIES/mL ESCHERICHIA COLI    Labs: CBC: Recent Labs  Lab 11/20/24 0357 11/21/24 0430 11/22/24 0424 11/23/24 0312  WBC 5.7 6.1 6.5 6.6  HGB 7.5* 7.1* 8.4* 8.9*  HCT 22.4* 22.4* 25.7* 26.8*  MCV 92.6 94.9 93.8 90.2  PLT 187 169 173 175   Basic Metabolic Panel: Recent Labs  Lab 11/22/24 0410 11/22/24 0424 11/23/24 0312 11/24/24 0215 11/25/24 0218 11/26/24 0711  NA 140  --  141 139 140 138  K 3.6  --  4.3 4.1 4.3 3.7  CL 108  --  110 106 107 106  CO2 22  --  22 23 24  21*  GLUCOSE 101*  --  102* 103* 97 131*  BUN 55*  --  46* 39* 33* 29*  CREATININE 3.34*  --  2.83* 2.58* 2.51* 2.19*  CALCIUM  8.5*  --  8.8* 9.1 9.1 9.4  MG  --  2.5*  1.7 2.1 1.7 2.2  PHOS 3.5  --  2.9 3.2 3.0 2.9   Liver Function Tests: Recent Labs  Lab 11/22/24 0410 11/23/24 0312 11/24/24 0215 11/25/24 0218 11/26/24 0711  ALBUMIN 2.8* 2.9* 3.0* 3.0* 3.1*   CBG: No results for input(s): GLUCAP in the last 168 hours.  Discharge time spent: 35 minutes.  Signed: Concepcion Riser, MD [facility] 11/26/2024 "

## 2024-11-26 NOTE — Progress Notes (Signed)
 Patient's daughter is here to transport patient home. Patient and daughter prefer not to wait any longer for rolling walker to be delivered. Patient's daughter states that patient's spouse has a walker at home that she can use.

## 2024-11-26 NOTE — TOC Transition Note (Addendum)
 Transition of Care [facility]) - Discharge Note   Patient Details  Name: Shannon Blevins MRN: [id number] Date of Birth: Apr 25, 1946  [facility]) CM/SW Contact:  Robynn Eileen Hoose, RN Phone Number: 11/26/2024, 3:10 PM   Clinical Narrative:   Patient being discharged home. Orders for Home health services noted. Spoke with daughter, no preference on Home Health agency. Referral sent to [facility] with [facility]. Awaiting response.  Daughter reports patient has bedside commode/3:1, just needs rolling walker. DME ordered through Jermaine with [employer] to be delivered to bedside before patient discharges home.  1514: Glenda with [facility] able to accept patient for home health services ordered. Contact information placed on AVS.    Final next level of care: Skilled Nursing Facility Barriers to Discharge: Continued Medical Work up, English As A Second Language Teacher, SNF Pending bed offer   Patient Goals and CMS Choice Patient states their goals for this hospitalization and ongoing recovery are:: SNF          Discharge Placement                       Discharge Plan and Services Additional resources added to the After Visit Summary for                                       Social Drivers of Health (SDOH) Interventions SDOH Screenings   Food Insecurity: No Food Insecurity (11/23/2024)  Housing: Low Risk (11/23/2024)  Transportation Needs: No Transportation Needs (11/23/2024)  Utilities: Not At Risk (11/23/2024)  Social Connections: Socially Integrated (11/23/2024)  Tobacco Use: Medium Risk (11/23/2024)     Readmission Risk Interventions     No data to display

## 2024-11-26 NOTE — Progress Notes (Signed)
 Patient transferred to 6E26 on room air, on transport monitor, accompanied by [facility] nurse. Report given in full to [facility], RN and all questions answered.

## 2024-11-26 NOTE — Progress Notes (Signed)
 " Cardiology Consultation:   Patient ID: [name] MRN: [id number]; DOB: November 02, 1946  Admit date: 11/17/2024 Date of Consult: 11/26/2024  Primary Care Provider: Valma Carwin, MD [facility] Cardiologist: Oneil Parchment, MD  [facility] Electrophysiologist:  Fonda Kitty, MD   Patient Profile:   Shannon Blevins is a 78 y.o. female with HTN, HLD, hypothyroidism, CKD, and anemia who is admitted for sx bradycardia and syncope now s/p successful LEFT MDT DC/LbaP PPM implant.   Interval Events:   No issues overnight. No significant pain at incision site. LEFT incision with minimal bleeding on dressing, dressing removed. CXR with stable lead position, device interrogation with stable lead parameters.   Past Medical History:  Diagnosis Date   Allergy    Anxiety    Hypertension    Renal disorder    Thyroid disease    Past Surgical History:  Procedure Laterality Date   ABDOMINAL HYSTERECTOMY     CATARACT EXTRACTION     LASIK     TEMPORARY PACEMAKER N/A 11/17/2024   Procedure: TEMPORARY PACEMAKER;  Surgeon: Wonda Sharper, MD;  Location: [facility] INVASIVE CV LAB;  Service: Cardiovascular;  Laterality: N/A;   Inpatient Medications: Scheduled Meds:  atorvastatin   10 mg Oral QPM   brexpiprazole   0.5 mg Oral Daily   ferrous sulfate   325 mg Oral Q breakfast   heparin   5,000 Units Subcutaneous Q8H   levothyroxine   137 mcg Oral QAC breakfast   PARoxetine   20 mg Oral Daily   thiamine   100 mg Oral Daily   Continuous Infusions:  PRN Meds: acetaminophen , docusate sodium , ondansetron  (ZOFRAN ) IV, mouth rinse, polyethylene glycol  Allergies:   Allergies[1]  Social History:   Social History   Socioeconomic History   Marital status: Married    Spouse name: Not on file   Number of children: Not on file   Years of education: Not on file   Highest education level: Not on file  Occupational History   Not on file  Tobacco Use   Smoking status: Former    Current packs/day: 0.00     Average packs/day: 1 pack/day for 20.0 years (20.0 ttl pk-yrs)    Types: Cigarettes    Start date: 32    Quit date: 2002    Years since quitting: 24.0   Smokeless tobacco: Never  Substance and Sexual Activity   Alcohol use: Not on file   Drug use: Not on file   Sexual activity: Not on file  Other Topics Concern   Not on file  Social History Narrative   Not on file   Social Drivers of Health   Tobacco Use: Medium Risk (11/23/2024)   Patient History    Smoking Tobacco Use: Former    Smokeless Tobacco Use: Never    Passive Exposure: Not on Actuary Strain: Not on file  Food Insecurity: No Food Insecurity (11/23/2024)   Epic    Worried About Programme Researcher, Broadcasting/film/video in the Last Year: Never true    Ran Out of Food in the Last Year: Never true  Transportation Needs: No Transportation Needs (11/23/2024)   Epic    Lack of Transportation (Medical): No    Lack of Transportation (Non-Medical): No  Physical Activity: Not on file  Stress: Not on file  Social Connections: Socially Integrated (11/23/2024)   Social Connection and Isolation Panel    Frequency of Communication with Friends and Family: Twice a week    Frequency of Social Gatherings with Friends and Family:  Twice a week    Attends Religious Services: More than 4 times per year    Active Member of Clubs or Organizations: Yes    Attends Banker Meetings: More than 4 times per year    Marital Status: Married  Catering Manager Violence: Not At Risk (11/23/2024)   Epic    Fear of Current or Ex-Partner: No    Emotionally Abused: No    Physically Abused: No    Sexually Abused: No  Depression (PHQ2-9): Not on file  Alcohol Screen: Not on file  Housing: Low Risk (11/23/2024)   Epic    Unable to Pay for Housing in the Last Year: No    Number of Times Moved in the Last Year: 0    Homeless in the Last Year: No  Utilities: Not At Risk (11/23/2024)   Epic    Threatened with loss of utilities: No   Health Literacy: Not on file    Family History:    Family History  Problem Relation Age of Onset   Cancer Mother    Hypertension Mother    Heart disease Mother    Alcohol abuse Father    Diabetes Paternal Grandmother    Cancer Paternal Grandmother     ROS:  Review of Systems: [y] = yes, [ ]  = no      General: Weight gain [ ] ; Weight loss [ ] ; Anorexia [ ] ; Fatigue [ ] ; Fever [ ] ; Chills [ ] ; Weakness [ ]    Cardiac: Chest pain/pressure [ ] ; Resting SOB [ ] ; Exertional SOB [ ] ; Orthopnea [ ] ; Pedal Edema [ ] ; Palpitations [ ] ; Syncope [ ] ; Presyncope [ ] ; Paroxysmal nocturnal dyspnea [ ]    Pulmonary: Cough [ ] ; Wheezing [ ] ; Hemoptysis [ ] ; Sputum [ ] ; Snoring [ ]    GI: Vomiting [ ] ; Dysphagia [ ] ; Melena [ ] ; Hematochezia [ ] ; Heartburn [ ] ; Abdominal pain [ ] ; Constipation [ ] ; Diarrhea [ ] ; BRBPR [ ]    GU: Hematuria [ ] ; Dysuria [ ] ; Nocturia [ ]  Vascular: Pain in legs with walking [ ] ; Pain in feet with lying flat [ ] ; Non-healing sores [ ] ; Stroke [ ] ; TIA [ ] ; Slurred speech [ ] ;   Neuro: Headaches [ ] ; Vertigo [ ] ; Seizures [ ] ; Paresthesias [ ] ;Blurred vision [ ] ; Diplopia [ ] ; Vision changes [ ]   Ortho/Skin: Arthritis [ ] ; Joint pain [ ] ; Muscle pain [ ] ; Joint swelling [ ] ; Back Pain [ ] ; Rash [ ]    Psych: Depression [ ] ; Anxiety [ ]    Heme: Bleeding problems [ ] ; Clotting disorders [ ] ; Anemia [ ]    Endocrine: Diabetes [ ] ; Thyroid dysfunction [ ]    Physical Exam/Data:   Vitals:   11/26/24 0100 11/26/24 0114 11/26/24 0522 11/26/24 0731  BP: (!) 127/95 (!) 148/99 (!) 147/73 121/69  Pulse: 100 89 77 84  Resp: 12 16 16 15   Temp:  98.4 F (36.9 C) 98.6 F (37 C) 98.3 F (36.8 C)  TempSrc:  Oral Oral Oral  SpO2: 98% 96% 98% 94%  Weight:  69.3 kg    Height:  5' 2 (1.575 m)     Intake/Output Summary (Last 24 hours) at 11/26/2024 1115 Last data filed at 11/26/2024 0800 Gross per 24 hour  Intake 800 ml  Output --  Net 800 ml      11/26/2024    1:14 AM  11/25/2024    5:00 AM 11/24/2024    5:00  AM  Last 3 Weights  Weight (lbs) 152 lb 11.2 oz 156 lb 15.5 oz 162 lb 11.2 oz  Weight (kg) 69.264 kg 71.2 kg 73.8 kg     Body mass index is 27.93 kg/m.  General:  Well nourished, well developed, in no acute distress HEENT: normal Cardiac:  normal S1, S2; RRR; no murmur  Lungs:  clear to auscultation bilaterally, no wheezing, rhonchi or rales  Abd: soft, nontender, no hepatomegaly  Ext: no edema Skin: warm and dry, LEFT incision with overlying steri strips  Psych:  Normal affect   EKG:  The EKG was personally reviewed and demonstrates: ASVP 75, PR 216, QRS 96 (LBaP capture), QT/c 392/437   Telemetry:  Telemetry was personally reviewed and demonstrates: ASVP 70-80s   Relevant CV Studies:  TTE  Result date: 11/17/24 1. Left ventricular ejection fraction, by estimation, is 50 to 55%. Left  ventricular ejection fraction by 2D MOD biplane is 53.0 %. The left  ventricle has low normal function. The left ventricle demonstrates  regional wall motion abnormalities (see  scoring diagram/findings for description). There is mild concentric left  ventricular hypertrophy. Left ventricular diastolic parameters are  consistent with Grade I diastolic dysfunction (impaired relaxation).   2. Right ventricular systolic function is normal. The right ventricular  size is mildly enlarged. There is normal pulmonary artery systolic  pressure.   3. Left atrial size was moderately dilated.   4. The mitral valve is normal in structure. No evidence of mitral valve  regurgitation. No evidence of mitral stenosis.   5. The aortic valve is tricuspid. Aortic valve regurgitation is trivial.  No aortic stenosis is present.   6. The inferior vena cava is normal in size with greater than 50%  respiratory variability, suggesting right atrial pressure of 3 mmHg.   Laboratory Data:  Recent Labs  Lab 11/24/24 0215 11/25/24 0218 11/26/24 0711  NA 139 140 138  K 4.1  4.3 3.7  CL 106 107 106  CO2 23 24 21*  GLUCOSE 103* 97 131*  BUN 39* 33* 29*  CREATININE 2.58* 2.51* 2.19*  CALCIUM  9.1 9.1 9.4  GFRNONAA 19* 19* 23*  ANIONGAP 10 9 11     Recent Labs  Lab 11/24/24 0215 11/25/24 0218 11/26/24 0711  ALBUMIN 3.0* 3.0* 3.1*   Hematology Recent Labs  Lab 11/21/24 0430 11/22/24 0424 11/23/24 0312  WBC 6.1 6.5 6.6  RBC 2.36* 2.74* 2.97*  HGB 7.1* 8.4* 8.9*  HCT 22.4* 25.7* 26.8*  MCV 94.9 93.8 90.2  MCH 30.1 30.7 30.0  MCHC 31.7 32.7 33.2  RDW 13.2 13.3 13.3  PLT 169 173 175   Radiology/Studies:  EP PPM/ICD IMPLANT Result Date: 11/25/2024 Table formatting from the original result was not included.  SURGEON:  Eulas Furbish, MD    PREPROCEDURE DIAGNOSES:  1. 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 Dual chamber permanent pacemaker implantation    INTRODUCTION: Shannon Blevins is a 78 y.o. patient who presents to the EP lab for dual chamber permanent pacemaker implantation.    DESCRIPTION OF PROCEDURE:  Informed written consent was obtained and the patient was brought to the electrophysiology lab in the fasting state. The patient was adequately sedated with intravenous Versed , and fentanyl  as outlined in the nursing report.  The patient's left chest was prepped and draped in the usual sterile fashion by the EP lab staff.  The skin overlying the left deltopectoral region was infiltrated with lidocaine  for local analgesia.  An incision was created over the left deltopectoral region.  A left subcutaneous pocket was fashioned using a combination of sharp and blunt dissection immediately superficial to the pectoral fascia.  Electrocautery was used to assure hemostasis. Lead Placement: The left axillary vein was cannulated using modified seldinger techniques.  Through the left axillary vein, an RV pace/sense lead was advanced to the RV mid  septum and advanced deeply, pausing intermittently to assess lead parameters. The lead was secured to the pectoral fascia. Next, the right atrial lead was advanced to the RA appendage. It was secured to the pectoral fascia.  Lead parameters are detailed below.  RA RV Model [id number] [id number] Serial # [id number] Amplitude 3.0 mV 5.5 mV Threshold 1.25 V @ 0.4 S 0.75 V @ 0.4 S Impedence 665 ohms 855 ohms The pocket was irrigated with copious gentamicin  solution.  The leads were then  connected to a pulse generator (model Medtronic Azure, serial [id number]). The pocket was closed in layers of absorbable suture. EBL < 10mL. Steri-strips and a sterile dressing were applied.  During this procedure the patient is administered Versed  and Fentanyl  by a trained provider under my direct supervision to achieve and maintain moderate conscious sedation.  The patient's heart rate, blood pressure, and oxygen saturation are monitored continuously during the procedure. The period of conscious sedation is 33 minutes, of which I was present face-to-face 100% of this time.    CONCLUSIONS:  1. Successful dual chamber permanent pacemaker implantation  3.  No early apparent complications. Eulas Furbish, MD Cardiac Electrophysiology   Assessment and Plan:  Jnyah A Patricelli is a 78 y.o. female with HTN, HLD, hypothyroidism, CKD, and anemia who is admitted for sx bradycardia and syncope now s/p successful LEFT MDT DC/LbaP PPM implant.   Sx bradycardia Syncope Successful LEFT MDT DC/LBaP PPM implant yesterday. CXR with stable lead position. ECG with LBaP capture. Device interrogation with stable lead parameters. OK to discharge from EP/cardiology perspective.   [facility] will sign off.   The patient is ready for discharge today from a cardiac standpoint. Medication Recommendations: d/c dilt and atenolol  Other recommendations (labs, testing, etc): can start ACEi/ARB once sCr as OP for HTN Follow up as an outpatient:  EP f/u in 3 weeks for wound check   For questions or updates, please contact [facility] Please consult [email or web address] for contact info under   Signed, Donnice DELENA Primus, MD  11/26/2024 11:15 AM     [1]  Allergies Allergen Reactions   Sulfa Antibiotics Nausea And Vomiting   "

## 2024-11-28 ENCOUNTER — Encounter (HOSPITAL_COMMUNITY): Payer: Self-pay | Admitting: Cardiovascular Disease

## 2024-11-28 MED FILL — Midazolam HCl Inj 2 MG/2ML (Base Equivalent): INTRAMUSCULAR | Qty: 2 | Status: AC

## 2024-11-29 ENCOUNTER — Telehealth: Payer: Self-pay

## 2024-11-29 NOTE — Telephone Encounter (Signed)
 Follow-up after same day discharge: Implant date: 11/25/2024 MD: Mealor  Device: PPM Location: L chest    Wound check visit: 12/07/2024 90 day MD follow-up: 02/24/2025  Remote Transmission received:yes  Dressing/sling removed: yes  Confirm OAC restart on: yes  Please continue to monitor your cardiac device site for redness, swelling, and drainage. Call the device clinic at [phone] if you experience these symptoms, fever/chills, or have questions about your device.   Remote monitoring is used to monitor your cardiac device from home. This monitoring is scheduled every 91 days by our office. It allows us  to keep an eye on the functioning of your device to ensure it is working properly.

## 2024-12-07 ENCOUNTER — Ambulatory Visit: Attending: Cardiovascular Disease

## 2024-12-07 DIAGNOSIS — I459 Conduction disorder, unspecified: Secondary | ICD-10-CM | POA: Diagnosis not present

## 2024-12-07 LAB — CUP PACEART INCLINIC DEVICE CHECK
Date Time Interrogation Session: 20260107121806
Implantable Lead Connection Status: 753985
Implantable Lead Connection Status: 753985
Implantable Lead Implant Date: 20251226
Implantable Lead Implant Date: 20251226
Implantable Lead Location: 753859
Implantable Lead Location: 753860
Implantable Lead Model: 3830
Implantable Lead Model: 5076
Implantable Pulse Generator Implant Date: 20251226

## 2024-12-07 NOTE — Patient Instructions (Signed)

## 2024-12-07 NOTE — Progress Notes (Signed)
 Normal dual chamber pacemaker wound check. Presenting rhythm: AS/VP 102. Wound well healed. Routine testing performed. Thresholds, sensing, and impedance consistent with implant measurements and at 3.5V safety margin/auto capture until 3 month visit. AT/AF burden <0.1%. Reviewed arm restrictions to continue for 6 weeks total post op.  Pt enrolled in remote follow-up.

## 2024-12-10 ENCOUNTER — Ambulatory Visit: Payer: Self-pay | Admitting: Cardiovascular Disease

## 2024-12-27 ENCOUNTER — Telehealth: Payer: Self-pay

## 2024-12-27 NOTE — Telephone Encounter (Signed)
 Alert received from [employer] for AF in progress from 1/26 @ 04:21, controlled rates, burden 0.2%, hx of PAF, no OAC per EPIC.  Routing to Dr. Nancey to advise if [facility] or referral to AF clinic to discuss [facility] is needed.

## 2024-12-27 NOTE — Telephone Encounter (Signed)
Pt called back returning call.

## 2024-12-27 NOTE — Telephone Encounter (Signed)
 Spoke to patients daughter Karna who clinical research associate discussed findings with and is agreeable to AF clinic apt to discuss OAC. Will forward to AF clinic for apt.

## 2024-12-27 NOTE — Telephone Encounter (Signed)
Attempted to contact patient. No answer, left message to call back

## 2025-01-02 NOTE — Progress Notes (Incomplete)
 Discussed the use of AI scribe software for clinical note transcription with the patient, who gave verbal consent to proceed.  History of Present Illness

## 2025-01-04 ENCOUNTER — Ambulatory Visit (HOSPITAL_COMMUNITY): Admitting: Nurse Practitioner

## 2025-01-06 ENCOUNTER — Ambulatory Visit

## 2025-01-11 ENCOUNTER — Ambulatory Visit (HOSPITAL_COMMUNITY): Admitting: Nurse Practitioner

## 2025-02-24 ENCOUNTER — Ambulatory Visit: Admitting: Cardiovascular Disease

## 2025-04-07 ENCOUNTER — Ambulatory Visit
